# Patient Record
Sex: Female | Born: 1970
Health system: Southern US, Community
[De-identification: ages and names within clinical notes are randomized; demographics above are authoritative.]

## PROBLEM LIST (undated history)

## (undated) DIAGNOSIS — D219 Benign neoplasm of connective and other soft tissue, unspecified: Secondary | ICD-10-CM

## (undated) HISTORY — PX: KNEE ARTHROPLASTY: SHX992

---

## 2015-09-13 ENCOUNTER — Other Ambulatory Visit: Payer: Self-pay

## 2015-09-13 DIAGNOSIS — Z1231 Encounter for screening mammogram for malignant neoplasm of breast: Secondary | ICD-10-CM

## 2015-10-04 ENCOUNTER — Ambulatory Visit: Payer: Self-pay

## 2015-10-20 ENCOUNTER — Ambulatory Visit
Admission: RE | Admit: 2015-10-20 | Discharge: 2015-10-20 | Disposition: A | Discharge status: Home / Self Care | Payer: 59 | Source: Ambulatory Visit

## 2015-10-20 DIAGNOSIS — Z1231 Encounter for screening mammogram for malignant neoplasm of breast: Secondary | ICD-10-CM

## 2016-07-16 DIAGNOSIS — H18469 Peripheral corneal degeneration, unspecified eye: Secondary | ICD-10-CM | POA: Diagnosis not present

## 2016-07-16 DIAGNOSIS — H524 Presbyopia: Secondary | ICD-10-CM | POA: Diagnosis not present

## 2016-07-16 MED FILL — RESTASIS MULTIDOSE 0.05% EY: 0.05 | 90 days supply | Qty: 17 | Fill #0

## 2016-09-21 DIAGNOSIS — N951 Menopausal and female climacteric states: Secondary | ICD-10-CM | POA: Diagnosis not present

## 2016-09-21 DIAGNOSIS — N920 Excessive and frequent menstruation with regular cycle: Secondary | ICD-10-CM | POA: Diagnosis not present

## 2016-09-21 DIAGNOSIS — Z01419 Encounter for gynecological examination (general) (routine) without abnormal findings: Secondary | ICD-10-CM | POA: Diagnosis not present

## 2016-11-27 ENCOUNTER — Other Ambulatory Visit: Payer: Self-pay | Admitting: Obstetrics and Gynecology

## 2016-11-27 DIAGNOSIS — Z1239 Encounter for other screening for malignant neoplasm of breast: Secondary | ICD-10-CM

## 2016-11-28 ENCOUNTER — Other Ambulatory Visit: Payer: Self-pay | Admitting: Obstetrics and Gynecology

## 2016-11-30 ENCOUNTER — Ambulatory Visit
Admission: RE | Admit: 2016-11-30 | Discharge: 2016-11-30 | Disposition: A | Discharge status: Home / Self Care | Payer: 59 | Source: Ambulatory Visit | Attending: Obstetrics and Gynecology | Admitting: Obstetrics and Gynecology

## 2016-11-30 DIAGNOSIS — Z1231 Encounter for screening mammogram for malignant neoplasm of breast: Secondary | ICD-10-CM | POA: Diagnosis not present

## 2016-11-30 DIAGNOSIS — Z1239 Encounter for other screening for malignant neoplasm of breast: Secondary | ICD-10-CM

## 2017-03-04 DIAGNOSIS — H18469 Peripheral corneal degeneration, unspecified eye: Secondary | ICD-10-CM | POA: Diagnosis not present

## 2017-03-04 DIAGNOSIS — H524 Presbyopia: Secondary | ICD-10-CM | POA: Diagnosis not present

## 2018-03-05 ENCOUNTER — Other Ambulatory Visit: Payer: Self-pay | Admitting: Obstetrics and Gynecology

## 2018-03-05 DIAGNOSIS — Z1231 Encounter for screening mammogram for malignant neoplasm of breast: Secondary | ICD-10-CM

## 2018-03-31 ENCOUNTER — Ambulatory Visit
Admission: RE | Admit: 2018-03-31 | Discharge: 2018-03-31 | Disposition: A | Discharge status: Home / Self Care | Payer: 59 | Source: Ambulatory Visit | Attending: Obstetrics and Gynecology | Admitting: Obstetrics and Gynecology

## 2018-03-31 ENCOUNTER — Encounter: Payer: Self-pay | Admitting: Radiology

## 2018-03-31 DIAGNOSIS — Z1231 Encounter for screening mammogram for malignant neoplasm of breast: Secondary | ICD-10-CM

## 2018-04-29 DIAGNOSIS — N920 Excessive and frequent menstruation with regular cycle: Secondary | ICD-10-CM | POA: Diagnosis not present

## 2018-04-29 DIAGNOSIS — Z124 Encounter for screening for malignant neoplasm of cervix: Secondary | ICD-10-CM | POA: Diagnosis not present

## 2018-04-29 DIAGNOSIS — Z1151 Encounter for screening for human papillomavirus (HPV): Secondary | ICD-10-CM | POA: Diagnosis not present

## 2018-04-29 DIAGNOSIS — Z01419 Encounter for gynecological examination (general) (routine) without abnormal findings: Secondary | ICD-10-CM | POA: Diagnosis not present

## 2018-08-18 DIAGNOSIS — H1045 Other chronic allergic conjunctivitis: Secondary | ICD-10-CM | POA: Diagnosis not present

## 2018-12-25 DIAGNOSIS — N951 Menopausal and female climacteric states: Secondary | ICD-10-CM | POA: Diagnosis not present

## 2018-12-25 DIAGNOSIS — N920 Excessive and frequent menstruation with regular cycle: Secondary | ICD-10-CM | POA: Diagnosis not present

## 2018-12-25 DIAGNOSIS — D251 Intramural leiomyoma of uterus: Secondary | ICD-10-CM | POA: Diagnosis not present

## 2018-12-25 DIAGNOSIS — Z30011 Encounter for initial prescription of contraceptive pills: Secondary | ICD-10-CM | POA: Diagnosis not present

## 2018-12-25 DIAGNOSIS — R1032 Left lower quadrant pain: Secondary | ICD-10-CM | POA: Diagnosis not present

## 2018-12-25 MED FILL — LARIN 21 1-20 TABLET: 1-20 | 84 days supply | Qty: 84 | Fill #0

## 2019-01-01 ENCOUNTER — Other Ambulatory Visit: Payer: Self-pay

## 2019-01-01 ENCOUNTER — Emergency Department (HOSPITAL_COMMUNITY): Payer: 59

## 2019-01-01 ENCOUNTER — Encounter (HOSPITAL_COMMUNITY): Payer: Self-pay | Admitting: *Deleted

## 2019-01-01 ENCOUNTER — Encounter (HOSPITAL_BASED_OUTPATIENT_CLINIC_OR_DEPARTMENT_OTHER): Payer: Self-pay | Admitting: *Deleted

## 2019-01-01 ENCOUNTER — Emergency Department (HOSPITAL_COMMUNITY)
Admission: EM | Admit: 2019-01-01 | Discharge: 2019-01-01 | Disposition: A | Discharge status: Home / Self Care | Payer: 59 | Attending: Emergency Medicine | Admitting: Emergency Medicine

## 2019-01-01 DIAGNOSIS — S8992XA Unspecified injury of left lower leg, initial encounter: Secondary | ICD-10-CM | POA: Diagnosis present

## 2019-01-01 DIAGNOSIS — Y9302 Activity, running: Secondary | ICD-10-CM | POA: Diagnosis not present

## 2019-01-01 DIAGNOSIS — X501XXA Overexertion from prolonged static or awkward postures, initial encounter: Secondary | ICD-10-CM | POA: Insufficient documentation

## 2019-01-01 DIAGNOSIS — S83242A Other tear of medial meniscus, current injury, left knee, initial encounter: Secondary | ICD-10-CM | POA: Diagnosis not present

## 2019-01-01 DIAGNOSIS — Y999 Unspecified external cause status: Secondary | ICD-10-CM | POA: Insufficient documentation

## 2019-01-01 DIAGNOSIS — M25462 Effusion, left knee: Secondary | ICD-10-CM | POA: Diagnosis not present

## 2019-01-01 DIAGNOSIS — Y929 Unspecified place or not applicable: Secondary | ICD-10-CM | POA: Diagnosis not present

## 2019-01-01 DIAGNOSIS — M25562 Pain in left knee: Secondary | ICD-10-CM | POA: Diagnosis not present

## 2019-01-01 MED ORDER — OXYCODONE-ACETAMINOPHEN 5-325 MG PO TABS: 1.0000 | ORAL_TABLET | Freq: Four times a day (QID) | ORAL | 0 refills | Status: DC | PRN

## 2019-01-01 MED ORDER — ACETAMINOPHEN 325 MG PO TABS
650.0000 mg | ORAL_TABLET | Freq: Once | ORAL | Status: AC
  Administered 2019-01-01: 650 mg via ORAL
  Filled 2019-01-01: qty 2

## 2019-01-01 MED FILL — OXYCODONE-ACETAMINOPHEN 5-3: 5-325 | 4 days supply | Qty: 10 | Fill #0

## 2019-01-01 NOTE — ED Notes (Signed)
Pt back from MRI 

## 2019-01-01 NOTE — Discharge Instructions (Addendum)
Please read attached information. If you experience any new or worsening signs or symptoms please return to the emergency room for evaluation. Please follow-up with your primary care provider or specialist as discussed. Please use medication prescribed only as directed and discontinue taking if you have any concerning signs or symptoms.   °

## 2019-01-01 NOTE — ED Provider Notes (Signed)
[facility] EMERGENCY DEPARTMENT Provider Note   CSN: [id number] Arrival date & time: 01/01/19  6195   History   Chief Complaint Chief Complaint  Patient presents with  . Knee Pain    HPI Heather Ray is a 48 y.o. female.  HPI   48 year old female presents today with complaints of left knee pain.  She notes that she is an active runner.  She notes over the weekend she was running on the beach she noted some left lateral knee pain.  She notes that symptoms often go away with rest.  She reports that this morning she was giving a tour walking upstairs when she felt a pop in her left knee.  She notes she was unable to bear weight thereafter.  She notes the pain is located in the posterior knee but is not reproduced with palpation.  She notes pain with flexion.  She does not have an orthopedic surgeon.  No medications prior to arrival.  History reviewed. No pertinent past medical history.  There are no active problems to display for this patient.   History reviewed. No pertinent surgical history.   OB History   No obstetric history on file.      Home Medications    Prior to Admission medications   Medication Sig Start Date End Date Taking? Authorizing Provider  oxyCODONE-acetaminophen (PERCOCET/ROXICET) 5-325 MG tablet Take 1 tablet by mouth every 6 (six) hours as needed for severe pain. 01/01/19   Okey Regal, PA-C    Family History Family History  Problem Relation Age of Onset  . Breast cancer Mother 84    Social History Social History   Tobacco Use  . Smoking status: Not on file  Substance Use Topics  . Alcohol use: Not on file  . Drug use: Not on file     Allergies   Patient has no known allergies.   Review of Systems Review of Systems  All other systems reviewed and are negative.    Physical Exam Updated Vital Signs BP (!) 150/95   Pulse 86   Temp 98.8 F (37.1 C)   Resp 16   SpO2 100%   Physical Exam Vitals signs  and nursing note reviewed.  Constitutional:      Appearance: She is well-developed.  HENT:     Head: Normocephalic and atraumatic.  Eyes:     General: No scleral icterus.       Right eye: No discharge.        Left eye: No discharge.     Conjunctiva/sclera: Conjunctivae normal.     Pupils: Pupils are equal, round, and reactive to light.  Neck:     Musculoskeletal: Normal range of motion.     Vascular: No JVD.     Trachea: No tracheal deviation.  Pulmonary:     Effort: Pulmonary effort is normal.     Breath sounds: No stridor.  Musculoskeletal:     Comments: Left knee atraumatic symmetrical bilateral, no anterior tenderness, no significant posterior or anterior tibial translation, no significant tenderness to palpation of the popliteal fossa or hamstrings-pain with approximately 100 degrees of flexion along the posterior aspect of the knee-no valgus or varus laxity  Neurological:     Mental Status: She is alert and oriented to person, place, and time.     Coordination: Coordination normal.  Psychiatric:        Behavior: Behavior normal.        Thought Content: Thought content normal.  Judgment: Judgment normal.      ED Treatments / Results  Labs (all labs ordered are listed, but only abnormal results are displayed) Labs Reviewed - No data to display  EKG None  Radiology Mr Knee Left Wo Contrast  Result Date: 01/01/2019 CLINICAL DATA:  Left knee pain and difficulty bearing weight since running on a beach 5-6 days ago. Initial encounter. EXAM: MRI OF THE LEFT KNEE WITHOUT CONTRAST TECHNIQUE: Multiplanar, multisequence MR imaging of the knee was performed. No intravenous contrast was administered. COMPARISON:  Plain films left knee this same day. FINDINGS: MENISCI Medial meniscus: There is a near complete radial tear through the root of the posterior horn. Mild intrasubstance degenerative signal is seen in the remainder of the posterior horn. Lateral meniscus:  Intact.  LIGAMENTS Cruciates:  Intact. Collaterals:  Intact. CARTILAGE Patellofemoral: Cartilage thinning is most notable at the patellar apex in the superior pole and along the medial facet. Medial:  Preserved. Lateral:  Preserved. Joint:  Small effusion. Popliteal Fossa:  Tiny Baker's cyst. Extensor Mechanism:  Intact. Bones: No fracture or worrisome lesion. Small subchondral cysts are seen at the patellar apex in the superior pole. Other: None. IMPRESSION: Near complete radial tear through the root of the posterior horn of the medial meniscus. Negative for ligament tear. Moderate patellofemoral osteoarthritis. Electronically Signed   By: Inge Rise M.D.   On: 01/01/2019 13:17   Dg Knee Complete 4 Views Left  Result Date: 01/01/2019 CLINICAL DATA:  Left lateral knee pain, initial encounter EXAM: LEFT KNEE - COMPLETE 4+ VIEW COMPARISON:  None. FINDINGS: Mild patellofemoral degenerative changes are noted. No acute fracture or dislocation is seen. No joint effusion is noted. IMPRESSION: Mild patellofemoral changes.  No acute abnormality noted. Electronically Signed   By: Inez Catalina M.D.   On: 01/01/2019 10:27    Procedures Procedures (including critical care time)  Medications Ordered in ED Medications  acetaminophen (TYLENOL) tablet 650 mg (650 mg Oral Given 01/01/19 1032)    Initial Impression / Assessment and Plan / ED Course  I have reviewed the triage vital signs and the nursing notes.  Pertinent labs & imaging results that were available during my care of the patient were reviewed by me and considered in my medical decision making (see chart for details).       Assessment/Plan: 48 year old female presents today with meniscal tear.  Patient has close follow-up arranged with orthopedic specialist tomorrow morning.  She was given pain medicine, knee immobilizer and crutches.  She is given strict return precautions.  She verbalized understanding and agreement to today's plan.   Final  Clinical Impressions(s) / ED Diagnoses   Final diagnoses:  Acute medial meniscus tear of left knee, initial encounter    ED Discharge Orders         Ordered    oxyCODONE-acetaminophen (PERCOCET/ROXICET) 5-325 MG tablet  Every 6 hours PRN     01/01/19 1332           Okey Regal, PA-C 01/01/19 1335    Tegeler, Gwenyth Allegra, MD 01/01/19 1606

## 2019-01-01 NOTE — ED Triage Notes (Signed)
Pt was walking and felt a pop in left knee. Unable bare weight on left leg.

## 2019-01-01 NOTE — ED Notes (Signed)
Pt up to void , MRI called on their way

## 2019-01-01 NOTE — ED Notes (Signed)
Patient transported to X-ray 

## 2019-01-02 ENCOUNTER — Encounter (HOSPITAL_BASED_OUTPATIENT_CLINIC_OR_DEPARTMENT_OTHER): Payer: Self-pay

## 2019-01-02 ENCOUNTER — Other Ambulatory Visit: Payer: Self-pay

## 2019-01-02 ENCOUNTER — Ambulatory Visit (HOSPITAL_BASED_OUTPATIENT_CLINIC_OR_DEPARTMENT_OTHER): Payer: 59 | Admitting: Anesthesiology

## 2019-01-02 ENCOUNTER — Ambulatory Visit (HOSPITAL_BASED_OUTPATIENT_CLINIC_OR_DEPARTMENT_OTHER)
Admission: RE | Admit: 2019-01-02 | Discharge: 2019-01-02 | Disposition: A | Discharge status: Home / Self Care | Payer: 59 | Attending: Orthopaedic Surgery | Admitting: Orthopaedic Surgery

## 2019-01-02 ENCOUNTER — Encounter (HOSPITAL_BASED_OUTPATIENT_CLINIC_OR_DEPARTMENT_OTHER)
Admission: RE | Disposition: A | Discharge status: Home / Self Care | Payer: Self-pay | Source: Home / Self Care | Attending: Orthopaedic Surgery

## 2019-01-02 DIAGNOSIS — S83242A Other tear of medial meniscus, current injury, left knee, initial encounter: Secondary | ICD-10-CM | POA: Diagnosis not present

## 2019-01-02 DIAGNOSIS — Z803 Family history of malignant neoplasm of breast: Secondary | ICD-10-CM | POA: Insufficient documentation

## 2019-01-02 DIAGNOSIS — Z79899 Other long term (current) drug therapy: Secondary | ICD-10-CM | POA: Insufficient documentation

## 2019-01-02 DIAGNOSIS — Y9302 Activity, running: Secondary | ICD-10-CM | POA: Diagnosis not present

## 2019-01-02 DIAGNOSIS — M1712 Unilateral primary osteoarthritis, left knee: Secondary | ICD-10-CM | POA: Insufficient documentation

## 2019-01-02 HISTORY — PX: KNEE ARTHROSCOPY WITH MEDIAL MENISECTOMY: SHX5651

## 2019-01-02 HISTORY — DX: Benign neoplasm of connective and other soft tissue, unspecified: D21.9

## 2019-01-02 LAB — POCT PREGNANCY, URINE: Preg Test, Ur: NEGATIVE

## 2019-01-02 SURGERY — ARTHROSCOPY, KNEE, WITH MEDIAL MENISCECTOMY
Anesthesia: General | Site: Knee | Laterality: Left

## 2019-01-02 MED ORDER — DEXAMETHASONE SODIUM PHOSPHATE 10 MG/ML IJ SOLN
INTRAMUSCULAR | Status: AC
  Filled 2019-01-02: qty 1

## 2019-01-02 MED ORDER — GABAPENTIN 300 MG PO CAPS
300.0000 mg | ORAL_CAPSULE | Freq: Once | ORAL | Status: AC
  Administered 2019-01-02: 300 mg via ORAL

## 2019-01-02 MED ORDER — CHLORHEXIDINE GLUCONATE 4 % EX LIQD: 60.0000 mL | Freq: Once | CUTANEOUS | Status: DC

## 2019-01-02 MED ORDER — PROPOFOL 10 MG/ML IV BOLUS
INTRAVENOUS | Status: DC | PRN
  Administered 2019-01-02: 150 mg via INTRAVENOUS

## 2019-01-02 MED ORDER — FENTANYL CITRATE (PF) 100 MCG/2ML IJ SOLN
25.0000 ug | INTRAMUSCULAR | Status: DC | PRN
  Administered 2019-01-02 (×2): 50 ug via INTRAVENOUS

## 2019-01-02 MED ORDER — MIDAZOLAM HCL 2 MG/2ML IJ SOLN
INTRAMUSCULAR | Status: AC
  Filled 2019-01-02: qty 2

## 2019-01-02 MED ORDER — CEFAZOLIN SODIUM-DEXTROSE 2-4 GM/100ML-% IV SOLN
INTRAVENOUS | Status: AC
  Filled 2019-01-02: qty 100

## 2019-01-02 MED ORDER — FENTANYL CITRATE (PF) 100 MCG/2ML IJ SOLN
INTRAMUSCULAR | Status: DC | PRN
  Administered 2019-01-02: 100 ug via INTRAVENOUS

## 2019-01-02 MED ORDER — ONDANSETRON HCL 4 MG/2ML IJ SOLN
INTRAMUSCULAR | Status: AC
  Filled 2019-01-02: qty 2

## 2019-01-02 MED ORDER — FENTANYL CITRATE (PF) 100 MCG/2ML IJ SOLN
INTRAMUSCULAR | Status: AC
  Filled 2019-01-02: qty 2

## 2019-01-02 MED ORDER — FENTANYL CITRATE (PF) 100 MCG/2ML IJ SOLN: 50.0000 ug | INTRAMUSCULAR | Status: DC | PRN

## 2019-01-02 MED ORDER — ASPIRIN 81 MG PO TABS: 81.0000 mg | ORAL_TABLET | Freq: Every day | ORAL | 0 refills | Status: AC

## 2019-01-02 MED ORDER — ACETAMINOPHEN 500 MG PO TABS
1000.0000 mg | ORAL_TABLET | Freq: Once | ORAL | Status: AC
  Administered 2019-01-02: 1000 mg via ORAL

## 2019-01-02 MED ORDER — PROMETHAZINE HCL 25 MG/ML IJ SOLN: 6.2500 mg | INTRAMUSCULAR | Status: DC | PRN

## 2019-01-02 MED ORDER — LACTATED RINGERS IV SOLN
INTRAVENOUS | Status: DC | PRN
  Administered 2019-01-02: 13:00:00 via INTRAVENOUS

## 2019-01-02 MED ORDER — SCOPOLAMINE 1 MG/3DAYS TD PT72: 1.0000 | MEDICATED_PATCH | Freq: Once | TRANSDERMAL | Status: DC | PRN

## 2019-01-02 MED ORDER — MIDAZOLAM HCL 5 MG/5ML IJ SOLN
INTRAMUSCULAR | Status: DC | PRN
  Administered 2019-01-02: 2 mg via INTRAVENOUS

## 2019-01-02 MED ORDER — ONDANSETRON HCL 4 MG/2ML IJ SOLN
INTRAMUSCULAR | Status: DC | PRN
  Administered 2019-01-02: 4 mg via INTRAVENOUS

## 2019-01-02 MED ORDER — DEXAMETHASONE SODIUM PHOSPHATE 4 MG/ML IJ SOLN
INTRAMUSCULAR | Status: DC | PRN
  Administered 2019-01-02: 10 mg via INTRAVENOUS

## 2019-01-02 MED ORDER — BUPIVACAINE HCL (PF) 0.25 % IJ SOLN
INTRAMUSCULAR | Status: AC
  Filled 2019-01-02: qty 30

## 2019-01-02 MED ORDER — LIDOCAINE 2% (20 MG/ML) 5 ML SYRINGE
INTRAMUSCULAR | Status: DC | PRN
  Administered 2019-01-02: 50 mg via INTRAVENOUS

## 2019-01-02 MED ORDER — SCOPOLAMINE 1 MG/3DAYS TD PT72
MEDICATED_PATCH | TRANSDERMAL | Status: AC
  Filled 2019-01-02: qty 1

## 2019-01-02 MED ORDER — GABAPENTIN 300 MG PO CAPS
ORAL_CAPSULE | ORAL | Status: AC
  Filled 2019-01-02: qty 1

## 2019-01-02 MED ORDER — SODIUM CHLORIDE 0.9 % IR SOLN
Status: DC | PRN
  Administered 2019-01-02: 800 mL

## 2019-01-02 MED ORDER — OXYCODONE HCL 5 MG PO TABS: ORAL_TABLET | ORAL | 0 refills | Status: AC

## 2019-01-02 MED ORDER — MIDAZOLAM HCL 2 MG/2ML IJ SOLN: 1.0000 mg | INTRAMUSCULAR | Status: DC | PRN

## 2019-01-02 MED ORDER — ONDANSETRON HCL 4 MG PO TABS: 4.0000 mg | ORAL_TABLET | Freq: Three times a day (TID) | ORAL | 1 refills | Status: AC | PRN

## 2019-01-02 MED ORDER — KETOROLAC TROMETHAMINE 30 MG/ML IJ SOLN
INTRAMUSCULAR | Status: AC
  Filled 2019-01-02: qty 1

## 2019-01-02 MED ORDER — LACTATED RINGERS IV SOLN: INTRAVENOUS | Status: DC

## 2019-01-02 MED ORDER — MELOXICAM 7.5 MG PO TABS: 7.5000 mg | ORAL_TABLET | Freq: Every day | ORAL | 2 refills | Status: DC

## 2019-01-02 MED ORDER — SCOPOLAMINE 1 MG/3DAYS TD PT72
1.0000 | MEDICATED_PATCH | Freq: Once | TRANSDERMAL | Status: DC
  Administered 2019-01-02: 1.5 mg via TRANSDERMAL

## 2019-01-02 MED ORDER — LIDOCAINE 2% (20 MG/ML) 5 ML SYRINGE
INTRAMUSCULAR | Status: AC
  Filled 2019-01-02: qty 5

## 2019-01-02 MED ORDER — CEFAZOLIN SODIUM-DEXTROSE 2-4 GM/100ML-% IV SOLN
2.0000 g | INTRAVENOUS | Status: AC
  Administered 2019-01-02: 2 g via INTRAVENOUS

## 2019-01-02 MED ORDER — SODIUM CHLORIDE 0.9 % IR SOLN
Status: DC | PRN
  Administered 2019-01-02: 2 mL

## 2019-01-02 MED ORDER — ACETAMINOPHEN 500 MG PO TABS
ORAL_TABLET | ORAL | Status: AC
  Filled 2019-01-02: qty 2

## 2019-01-02 MED ORDER — PROPOFOL 10 MG/ML IV BOLUS
INTRAVENOUS | Status: AC
  Filled 2019-01-02: qty 20

## 2019-01-02 MED ORDER — BUPIVACAINE HCL (PF) 0.25 % IJ SOLN
INTRAMUSCULAR | Status: DC | PRN
  Administered 2019-01-02: 20 mL

## 2019-01-02 MED ORDER — EPINEPHRINE 30 MG/30ML IJ SOLN
INTRAMUSCULAR | Status: AC
  Filled 2019-01-02: qty 1

## 2019-01-02 MED ORDER — KETOROLAC TROMETHAMINE 30 MG/ML IJ SOLN
INTRAMUSCULAR | Status: DC | PRN
  Administered 2019-01-02: 30 mg via INTRAVENOUS

## 2019-01-02 MED ORDER — ACETAMINOPHEN 500 MG PO TABS: 1000.0000 mg | ORAL_TABLET | Freq: Three times a day (TID) | ORAL | 0 refills | Status: AC

## 2019-01-02 MED FILL — ONDANSETRON HCL 4 MG TABLET: 4 | 4 days supply | Qty: 10 | Fill #0

## 2019-01-02 MED FILL — MELOXICAM 7.5 MG TABLET: 7.5 | 30 days supply | Qty: 30 | Fill #0

## 2019-01-02 MED FILL — oxyCODONE HCL 5 MG TABS: 5 | 3 days supply | Qty: 15 | Fill #0

## 2019-01-02 MED FILL — ASPIRIN ADULT LOW STRENGTH: 81 | 45 days supply | Qty: 45 | Fill #0

## 2019-01-02 SURGICAL SUPPLY — 42 items
BANDAGE ACE 6X5 VEL STRL LF (GAUZE/BANDAGES/DRESSINGS) ×3 IMPLANT
BANDAGE ESMARK 6X9 LF (GAUZE/BANDAGES/DRESSINGS) IMPLANT
BENZOIN TINCTURE PRP APPL 2/3 (GAUZE/BANDAGES/DRESSINGS) IMPLANT
BLADE CLIPPER SURG (BLADE) IMPLANT
BNDG ESMARK 6X9 LF (GAUZE/BANDAGES/DRESSINGS)
CHLORAPREP W/TINT 26ML (MISCELLANEOUS) ×3 IMPLANT
CLOSURE STERI-STRIP 1/2X4 (GAUZE/BANDAGES/DRESSINGS) ×1
CLSR STERI-STRIP ANTIMIC 1/2X4 (GAUZE/BANDAGES/DRESSINGS) ×2 IMPLANT
CUFF TOURNIQUET SINGLE 34IN LL (TOURNIQUET CUFF) ×3 IMPLANT
DISSECTOR 3.5MM X 13CM CVD (MISCELLANEOUS) ×3 IMPLANT
DISSECTOR 4.0MMX13CM CVD (MISCELLANEOUS) IMPLANT
DRAPE ARTHROSCOPY W/POUCH 90 (DRAPES) ×3 IMPLANT
DRAPE IMP U-DRAPE 54X76 (DRAPES) ×3 IMPLANT
DRAPE INCISE IOBAN 66X45 STRL (DRAPES) IMPLANT
DRAPE U-SHAPE 47X51 STRL (DRAPES) ×3 IMPLANT
GAUZE SPONGE 4X4 12PLY STRL (GAUZE/BANDAGES/DRESSINGS) IMPLANT
GLOVE BIO SURGEON STRL SZ 6.5 (GLOVE) ×2 IMPLANT
GLOVE BIO SURGEONS STRL SZ 6.5 (GLOVE) ×1
GLOVE BIOGEL PI IND STRL 7.0 (GLOVE) ×1 IMPLANT
GLOVE BIOGEL PI IND STRL 8 (GLOVE) ×1 IMPLANT
GLOVE BIOGEL PI INDICATOR 7.0 (GLOVE) ×2
GLOVE BIOGEL PI INDICATOR 8 (GLOVE) ×2
GLOVE ECLIPSE 8.0 STRL XLNG CF (GLOVE) ×6 IMPLANT
GLOVE EXAM NITRILE MD LF STRL (GLOVE) ×3 IMPLANT
GOWN STRL REUS W/ TWL LRG LVL3 (GOWN DISPOSABLE) ×1 IMPLANT
GOWN STRL REUS W/TWL LRG LVL3 (GOWN DISPOSABLE) ×2
GOWN STRL REUS W/TWL XL LVL3 (GOWN DISPOSABLE) ×6 IMPLANT
IV NS IRRIG 3000ML ARTHROMATIC (IV SOLUTION) ×6 IMPLANT
KIT TURNOVER KIT B (KITS) ×3 IMPLANT
MANIFOLD NEPTUNE II (INSTRUMENTS) ×3 IMPLANT
NDL SAFETY ECLIPSE 18X1.5 (NEEDLE) ×1 IMPLANT
NEEDLE HYPO 18GX1.5 SHARP (NEEDLE) ×2
NS IRRIG 1000ML POUR BTL (IV SOLUTION) IMPLANT
PACK ARTHROSCOPY DSU (CUSTOM PROCEDURE TRAY) ×3 IMPLANT
SLEEVE SCD COMPRESS KNEE MED (MISCELLANEOUS) IMPLANT
SUT MNCRL AB 4-0 PS2 18 (SUTURE) ×3 IMPLANT
SYR 5ML LUER SLIP (SYRINGE) ×3 IMPLANT
TOWEL GREEN STERILE FF (TOWEL DISPOSABLE) ×3 IMPLANT
TUBE CONNECTING 20'X1/4 (TUBING) ×1
TUBE CONNECTING 20X1/4 (TUBING) ×2 IMPLANT
TUBING ARTHROSCOPY IRRIG 16FT (MISCELLANEOUS) ×3 IMPLANT
WATER STERILE IRR 1000ML POUR (IV SOLUTION) IMPLANT

## 2019-01-02 NOTE — Op Note (Signed)
Orthopaedic Surgery Operative Note (CSN: [id number])  Heather Ray  03/09/1971 Date of Surgery: 01/02/2019   Diagnoses:  LEFT MEDIAL MENISCUS TEAR  Procedure: Left knee partial medial meniscectomy 29881 Left knee patellar chondroplasty 54656  Operative Finding Successful completion of planned procedure.  Patient's meniscal root tear was not completely torn but was clearly unstable and causing mechanical type symptoms.  We felt based on her patellofemoral arthritis as well as early changes in her medial compartment was better suited to meniscectomy versus the long recovery and the possible stiffness and potential failure of a meniscal root repair.  Exam under anesthesia: Range of motion full and symmetric to opposite knee, ligamentously stable exam with normal lachman  Suprapatellar pouch: Patellar pouch was normal but there was grade 4 chondral loss in the medial facet of the patella.  This was debrided back to a stable base, there is grade 1 changes in the trochlea.  Medial compartment: Grade 2 changes on the femoral condyle and grade 1 changes in the tibia.  We felt the patient be better served with a partial meniscectomy so we performed a 30% total meniscal volume reduction of the posterior medial meniscus.  The root was not completely torn however it was clearly unstable in regards to the torn portion.  Lateral Compartment: Preserved  Intercondylar Notch: Normal  Post-operative plan: The patient will be discharged home weightbearing as tolerated.  The patient will be not indicated for therapy and the stiffness is present.  DVT prophylaxis aspirin 81 mg a day.  Pain control with PRN pain medication preferring oral medicines.  Follow up plan will be scheduled in approximately 7 days.  Post-Op Diagnosis: Same Surgeons:Primary: Hiram Gash, MD Assistants: Joya Gaskins, OPAC Location: [facility] OR ROOM 3 Anesthesia: General Antibiotics: Ancef 2g preop Tourniquet time:  Total Tourniquet  Time Documented: Thigh (Left) - 25 minutes Total: Thigh (Left) - 25 minutes  Estimated Blood Loss: Minimal Complications: None Specimens: None Implants: * No implants in log *  Indications for Surgery:   Heather Ray is a 48 y.o. female with acute injury mechanical symptoms limiting weightbearing and active healthy person.  She was significantly debilitated by her symptoms even after bracing and reduced weightbearing.  Benefits and risks of operative and nonoperative management were discussed prior to surgery with patient/guardian(s) and informed consent form was completed.  Specific risks including infection, need for additional surgery, continued symptoms, postoperative arthrosis, stiffness, need for therapy.   Procedure:   The patient was identified properly. Informed consent was obtained and the surgical site was marked. The patient was taken up to suite where general anesthesia was induced. The patient was placed in the supine position with a post against the surgical leg and a nonsterile tourniquet applied. The surgical leg was then prepped and draped usual sterile fashion.  A standard surgical timeout was performed.  2 standard anterior portals were made and diagnostic arthroscopy performed. Please note the findings as noted above.  Perform diagnostic arthroscopy and findings are above.  We felt the meniscus was not amenable to root repair and performed a debridement using arthroscopic baskets as well as a shaving device.  This was taken back to a stable base.  20% total meniscal volume resected and no additional unstable remnant was noted.  Gentle chondroplasty was performed with a shaver of the medial facet of the patella.   Incisions closed with absorbable suture. The patient was awoken from general anesthesia and taken to the PACU in stable condition without complication.  Joya Gaskins, OPA-C, present and scrubbed throughout the case, critical for completion in a timely  fashion, and for retraction, instrumentation, closure.

## 2019-01-02 NOTE — Anesthesia Procedure Notes (Signed)
Procedure Name: LMA Insertion Performed by: Emilliano Dilworth W, CRNA Pre-anesthesia Checklist: Patient identified, Emergency Drugs available, Suction available and Patient being monitored Patient Re-evaluated:Patient Re-evaluated prior to induction Oxygen Delivery Method: Circle system utilized Preoxygenation: Pre-oxygenation with 100% oxygen Induction Type: IV induction Ventilation: Mask ventilation without difficulty LMA: LMA inserted LMA Size: 4.0 Number of attempts: 1 Placement Confirmation: positive ETCO2 Tube secured with: Tape Dental Injury: Teeth and Oropharynx as per pre-operative assessment        

## 2019-01-02 NOTE — Transfer of Care (Signed)
Immediate Anesthesia Transfer of Care Note  Patient: Heather Ray  Procedure(s) Performed: LEFT KNEE ARTHROSCOPY WITH PARTIAL MEDIAL MENISECTOMY (Left Knee)  Patient Location: PACU  Anesthesia Type:General  Level of Consciousness: awake and sedated  Airway & Oxygen Therapy: Patient Spontanous Breathing and Patient connected to face mask oxygen  Post-op Assessment: Report given to RN and Post -op Vital signs reviewed and stable  Post vital signs: Reviewed and stable  Last Vitals:  Vitals Value Taken Time  BP 136/82 01/02/2019  2:02 PM  Temp    Pulse 87 01/02/2019  2:03 PM  Resp 13 01/02/2019  2:03 PM  SpO2 97 % 01/02/2019  2:03 PM  Vitals shown include unvalidated device data.  Last Pain:  Vitals:   01/02/19 1021  TempSrc: Oral  PainSc: 4       Patients Stated Pain Goal: 4 (46/50/35 4656)  Complications: No apparent anesthesia complications

## 2019-01-02 NOTE — H&P (Signed)
PREOPERATIVE H&P  Chief Complaint: LEFT MEDIAL MENISCUS TEAR  HPI: Heather Ray is a 48 y.o. female who presents for preoperative history and physical with a diagnosis of LEFT MEDIAL MENISCUS TEAR. Symptoms are rated as moderate to severe, and have been worsening.  This is significantly impairing activities of daily living.  Please see my clinic note for full details on this patient's care.  She has elected for surgical management.   Past Medical History:  Diagnosis Date  . Fibroids    Past Surgical History:  Procedure Laterality Date  . CESAREAN SECTION    . KNEE ARTHROPLASTY     Social History   Socioeconomic History  . Marital status: Married    Spouse name: Not on file  . Number of children: Not on file  . Years of education: Not on file  . Highest education level: Not on file  Occupational History  . Not on file  Social Needs  . Financial resource strain: Not on file  . Food insecurity:    Worry: Not on file    Inability: Not on file  . Transportation needs:    Medical: Not on file    Non-medical: Not on file  Tobacco Use  . Smoking status: Never Smoker  . Smokeless tobacco: Never Used  Substance and Sexual Activity  . Alcohol use: Never    Frequency: Never  . Drug use: Never  . Sexual activity: Not on file  Lifestyle  . Physical activity:    Days per week: Not on file    Minutes per session: Not on file  . Stress: Not on file  Relationships  . Social connections:    Talks on phone: Not on file    Gets together: Not on file    Attends religious service: Not on file    Active member of club or organization: Not on file    Attends meetings of clubs or organizations: Not on file    Relationship status: Not on file  Other Topics Concern  . Not on file  Social History Narrative  . Not on file   Family History  Problem Relation Age of Onset  . Breast cancer Mother 12   No Known Allergies Prior to Admission medications   Medication Sig Start Date End  Date Taking? Authorizing Provider  norethindrone-ethinyl estradiol Ronnette Juniper 1/20) 1-20 MG-MCG tablet Take 1 tablet by mouth daily.   Yes [provider]  oxyCODONE-acetaminophen (PERCOCET/ROXICET) 5-325 MG tablet Take 1 tablet by mouth every 6 (six) hours as needed for severe pain. 01/01/19  Yes Hedges, Dellis Filbert, PA-C     Positive ROS: All other systems have been reviewed and were otherwise negative with the exception of those mentioned in the HPI and as above.  Physical Exam: General: Alert, no acute distress Cardiovascular: No pedal edema Respiratory: No cyanosis, no use of accessory musculature GI: No organomegaly, abdomen is soft and non-tender Skin: No lesions in the area of chief complaint Neurologic: Sensation intact distally Psychiatric: Patient is competent for consent with normal mood and affect Lymphatic: No axillary or cervical lymphadenopathy  MUSCULOSKELETAL: LLE: limited ROM, pain medially and laterally.  WWP extremity.  Assessment: LEFT MEDIAL MENISCUS TEAR  Plan: Plan for Procedure(s): KNEE ARTHROSCOPY WITH MEDIAL MENISECTOMY VERSES MEDIAL MENISCUS REPAIR  The risks benefits and alternatives were discussed with the patient including but not limited to the risks of nonoperative treatment, versus surgical intervention including infection, bleeding, nerve injury,  blood clots, cardiopulmonary complications, morbidity, mortality, among others,  and they were willing to proceed.   Hiram Gash, MD  01/02/2019 12:48 PM

## 2019-01-02 NOTE — Anesthesia Postprocedure Evaluation (Signed)
Anesthesia Post Note  Patient: Heather Ray  Procedure(s) Performed: LEFT KNEE ARTHROSCOPY WITH PARTIAL MEDIAL MENISECTOMY AND PATELLAR CHONDROPLASTY (Left Knee)     Patient location during evaluation: PACU Anesthesia Type: General Level of consciousness: awake and alert Pain management: pain level controlled Vital Signs Assessment: post-procedure vital signs reviewed and stable Respiratory status: spontaneous breathing, nonlabored ventilation and respiratory function stable Cardiovascular status: blood pressure returned to baseline and stable Postop Assessment: no apparent nausea or vomiting Anesthetic complications: no    Last Vitals:  Vitals:   01/02/19 1430 01/02/19 1500  BP:  135/68  Pulse:  70  Resp: 18 18  Temp:  37 C  SpO2: 97% 100%    Last Pain:  Vitals:   01/02/19 1500  TempSrc:   PainSc: 4                  Catalina Gravel

## 2019-01-02 NOTE — Discharge Instructions (Signed)
°  Post Anesthesia Home Care Instructions  No Ibuprofen until after 730pm 01/02/19.  Activity: Get plenty of rest for the remainder of the day. A responsible individual must stay with you for 24 hours following the procedure.  For the next 24 hours, DO NOT: -Drive a car -Paediatric nurse -Drink alcoholic beverages -Take any medication unless instructed by your physician -Make any legal decisions or sign important papers.  Meals: Start with liquid foods such as gelatin or soup. Progress to regular foods as tolerated. Avoid greasy, spicy, heavy foods. If nausea and/or vomiting occur, drink only clear liquids until the nausea and/or vomiting subsides. Call your physician if vomiting continues.  Special Instructions/Symptoms: Your throat may feel dry or sore from the anesthesia or the breathing tube placed in your throat during surgery. If this causes discomfort, gargle with warm salt water. The discomfort should disappear within 24 hours.  If you had a scopolamine patch placed behind your ear for the management of post- operative nausea and/or vomiting:  1. The medication in the patch is effective for 72 hours, after which it should be removed.  Wrap patch in a tissue and discard in the trash. Wash hands thoroughly with soap and water. 2. You may remove the patch earlier than 72 hours if you experience unpleasant side effects which may include dry mouth, dizziness or visual disturbances. 3. Avoid touching the patch. Wash your hands with soap and water after contact with the patch.

## 2019-01-02 NOTE — Anesthesia Preprocedure Evaluation (Addendum)
Anesthesia Evaluation  Patient identified by MRN, date of birth, ID band Patient awake    Reviewed: Allergy & Precautions, NPO status , Patient's Chart, lab work & pertinent test results  History of Anesthesia Complications Negative for: history of anesthetic complications  Airway Mallampati: II  TM Distance: >3 FB Neck ROM: Full    Dental  (+) Teeth Intact, Dental Advisory Given   Pulmonary neg pulmonary ROS,    Pulmonary exam normal breath sounds clear to auscultation       Cardiovascular Exercise Tolerance: Good negative cardio ROS Normal cardiovascular exam Rhythm:Regular Rate:Normal     Neuro/Psych negative neurological ROS     GI/Hepatic negative GI ROS, Neg liver ROS,   Endo/Other  negative endocrine ROS  Renal/GU negative Renal ROS     Musculoskeletal LEFT MEDIAL MENISCUS TEAR   Abdominal   Peds  Hematology negative hematology ROS (+)   Anesthesia Other Findings Day of surgery medications reviewed with the patient.  Reproductive/Obstetrics negative OB ROS                            Anesthesia Physical Anesthesia Plan  ASA: I  Anesthesia Plan: General   Post-op Pain Management:    Induction: Intravenous  PONV Risk Score and Plan: 4 or greater and Midazolam, Dexamethasone, Ondansetron and Scopolamine patch - Pre-op  Airway Management Planned: LMA  Additional Equipment:   Intra-op Plan:   Post-operative Plan: Extubation in OR  Informed Consent: I have reviewed the patients History and Physical, chart, labs and discussed the procedure including the risks, benefits and alternatives for the proposed anesthesia with the patient or authorized representative who has indicated his/her understanding and acceptance.     Dental advisory given  Plan Discussed with: CRNA  Anesthesia Plan Comments:         Anesthesia Quick Evaluation

## 2019-01-05 ENCOUNTER — Encounter (HOSPITAL_BASED_OUTPATIENT_CLINIC_OR_DEPARTMENT_OTHER): Payer: Self-pay | Admitting: Orthopaedic Surgery

## 2019-01-08 DIAGNOSIS — S83242D Other tear of medial meniscus, current injury, left knee, subsequent encounter: Secondary | ICD-10-CM | POA: Diagnosis not present

## 2019-01-14 ENCOUNTER — Encounter: Payer: Self-pay | Admitting: Physical Therapy

## 2019-01-14 ENCOUNTER — Ambulatory Visit: Payer: 59 | Attending: Orthopaedic Surgery | Admitting: Physical Therapy

## 2019-01-14 DIAGNOSIS — M25662 Stiffness of left knee, not elsewhere classified: Secondary | ICD-10-CM | POA: Insufficient documentation

## 2019-01-14 DIAGNOSIS — R262 Difficulty in walking, not elsewhere classified: Secondary | ICD-10-CM | POA: Insufficient documentation

## 2019-01-14 DIAGNOSIS — M25562 Pain in left knee: Secondary | ICD-10-CM | POA: Insufficient documentation

## 2019-01-14 NOTE — Patient Instructions (Signed)
Access Code: [email or web address]  URL: [email or web address]  Date: 01/14/2019  Prepared by: Elsie Ra   Exercises  Supine Hamstring Stretch with Strap - 3 sets - 30 hold - 2x daily - 6x weekly  Supine Heel Slide with Strap - 10 reps - 2-3 sets - 5 hold - 2x daily - 6x weekly  Supine Quad Set - 10 reps - 2-3 sets - 5 sec hold - 2x daily - 6x weekly  Supine Active Straight Leg Raise - 10 reps - 1-3 sets - 2x daily - 6x weekly  Seated Long Arc Quad - 10 reps - 2-3 sets - 3 hold - 2x daily - 6x weekly  Supine Knee Extension Mobilization with Weight - 1 sets - 3-5 min hold - 2x daily - 6x weekly  Standing Knee Flexion - 10 reps - 3 sets - 2x daily - 6x weekly  Walking March - 10 reps - 3 sets - 2x daily - 6x weekly

## 2019-01-14 NOTE — Therapy (Signed)
[place], [place], [address] Phone: [phone]   Fax:  [phone]  Physical Therapy Evaluation  Patient Details  Name: Heather Ray MRN: [id number] Date of Birth: [birth year] Referring Provider (PT): Ophelia Charter, MD   Encounter Date: 01/14/2019  PT End of Session - 01/14/19 1922    Visit Number  1    Number of Visits  8    Date for PT Re-Evaluation  02/11/19    Authorization Type  [facility]    PT Start Time  0430    PT Stop Time  0517    PT Time Calculation (min)  47 min    Activity Tolerance  Patient tolerated treatment well    Behavior During Therapy  [facility] for tasks assessed/performed       Past Medical History:  Diagnosis Date  . Fibroids     Past Surgical History:  Procedure Laterality Date  . CESAREAN SECTION    . KNEE ARTHROPLASTY    . KNEE ARTHROSCOPY WITH MEDIAL MENISECTOMY Left 01/02/2019   Procedure: LEFT KNEE ARTHROSCOPY WITH PARTIAL MEDIAL MENISECTOMY AND PATELLAR CHONDROPLASTY;  Surgeon: Hiram Gash, MD;  Location: [place];  Service: Orthopedics;  Laterality: Left;    There were no vitals filed for this visit.   Subjective Assessment - 01/14/19 1909    Subjective  Pt had L Knee Partial Medical Meniscectomy & patellar chondroplasty 01/02/19,. She now has complaints of knee pain and stiffness and difficulty with stairs and walking. She is WBAT    Pertinent History  no significant PMH    Limitations  Standing;Walking;House hold activities    How long can you sit comfortably?  not limited    How long can you stand comfortably?  10 min    How long can you walk comfortably?  one block    Diagnostic tests  MRI confirmed medial meniscus tear    Patient Stated Goals  walk normal, get my knee back to normal    Currently in Pain?  Yes    Pain Score  5     Pain Location  Knee    Pain Orientation  Left    Pain Descriptors / Indicators  Aching;Sharp    Pain Type   Surgical pain    Pain Radiating Towards  denies    Pain Onset  1 to 4 weeks ago    Pain Frequency  Intermittent    Aggravating Factors   walking, stairs,    Pain Relieving Factors  rest, ice, NSAIDS    Multiple Pain Sites  No         [facility] PT Assessment - 01/14/19 0001      Assessment   Medical Diagnosis  L Knee Partial Medical Meniscectomy & patellar chondroplasty    Referring Provider (PT)  Ophelia Charter, MD    Onset Date/Surgical Date  01/02/19    Next MD Visit  3 weeks    Prior Therapy  none      Precautions   Precautions  None      Restrictions   Weight Bearing Restrictions  No      Balance Screen   Has the patient fallen in the past 6 months  No      [place] residence    Additional Comments  3 steps to enter      Prior Function   Level of Protection  Full time employment    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      Cognition   Overall Cognitive Status  Within Functional Limits for tasks assessed      Observation/Other Assessments   Focus on Therapeutic Outcomes (FOTO)   62%limited      Sensation   Light Touch  Appears Intact      Coordination   Gross Motor Movements are Fluid and Coordinated  Yes      ROM / Strength   AROM / PROM / Strength  AROM;Strength      AROM   AROM Assessment Site  Knee    Right/Left Knee  Right;Left    Right Knee Extension  0    Right Knee Flexion  125    Left Knee Extension  -5    Left Knee Flexion  110      Strength   Overall Strength Comments  4/5 MMT Lt knee, 4+/5 MMT Lt hip       Flexibility   Soft Tissue Assessment /Muscle Length  --   tightness in quads and H.S Lt     Palpation   Patella mobility  good mobiliy lateral glides, some hypomobility with inf-sup glides, no TTP reported, very little swelling, no redness or warmth present, incision site well healing      Transfers   Transfers  Independent with all Transfers       Ambulation/Gait   Gait Pattern  Step-through pattern;Decreased step length - left;Decreased stance time - left;Decreased hip/knee flexion - left;Decreased dorsiflexion - left;Decreased weight shift to left;Left flexed knee in stance    Gait Comments  gait pattern improved after gait traning today for march walking and heel toe walking to encourage more knee flexion during swing phase and more knee ext during stance phase                Objective measurements completed on examination: See above findings.      [facility] Adult PT Treatment/Exercise - 01/14/19 0001      Exercises   Exercises  Knee/Hip      Knee/Hip Exercises: Stretches   Passive Hamstring Stretch  Left;2 reps;30 seconds    Other Knee/Hip Stretches  heelslides with strap 5 sec X 15 reps      Knee/Hip Exercises: Standing   Knee Flexion  Left;10 reps    Other Standing Knee Exercises  march walk at counter and walk to overemphasize heel strike and toe off      Knee/Hip Exercises: Seated   Long Arc Quad  Left;10 reps      Knee/Hip Exercises: Supine   Quad Sets  Left;10 reps    Heel Prop for Knee Extension  3 minutes    Heel Prop for Knee Extension Limitations  during first 3 min of ice    Straight Leg Raises  10 reps;Left    Patellar Mobs  sup-inf X 10      Modalities   Modalities  Cryotherapy      Cryotherapy   Number Minutes Cryotherapy  10 Minutes    Cryotherapy Location  Knee    Type of Cryotherapy  Ice pack             PT Education - 01/14/19 1922    Education Details  HEP, POC, Ice, gait training    Person(s) Educated  Patient    Methods  Explanation;Demonstration;Verbal cues;Handout    Comprehension  Verbalized understanding;Returned demonstration;Need further instruction  PT Long Term Goals - 01/14/19 1929      PT LONG TERM GOAL #1   Title  Pt will be I and compliant with HEP. 4 weeks 02/11/19    Status  New      PT LONG TERM GOAL #2   Title  Pt will increase Lt knee AROM  to 0-125 deg to improve function. 4 weeks 02/11/19    Status  New      PT LONG TERM GOAL #3   Title  Pt will improve Lt LE strength to 5/5 MMT. 4 weeks 02/11/19    Status  New      PT LONG TERM GOAL #4   Title  Pt will improve FOTO to less than 41% limited. 4 weeks 02/11/19    Status  New      PT LONG TERM GOAL #5   Title  Pt will show WNL gait pattern for community ambulation and on uneven surfaces and up/down stairs reciprocally all with less than 2/10 knee pain. 4 weeks 02/11/19    Status  New             Plan - 01/14/19 1923    Clinical Impression Statement  Pt presents with Lt Knee, weakness and stiffness S/P Partial Medical Meniscectomy & patellar chondroplasty 01/02/19. She is WBAT and overall doing pretty well. She does have stiff gait with decreased knee flexion during swing phase and decreased knee ext with stance phase but his greatly improved after gait training today. She will benefit from skilled PT to address her defecits.     Clinical Presentation  Stable    Clinical Decision Making  Low    Rehab Potential  Excellent    Clinical Impairments Affecting Rehab Potential  has to walk a lot at work    PT Frequency  2x / week    PT Duration  4 weeks    PT Treatment/Interventions  Cryotherapy;Electrical Stimulation;Iontophoresis 4mg /ml Dexamethasone;Moist Heat;Ultrasound;Therapeutic exercise;Neuromuscular re-education;Therapeutic activities;Manual techniques;Passive range of motion;Dry needling;Taping;Joint Manipulations    PT Next Visit Plan  review HEP, MT and PROM for patella mobs and knee ROM, gait training, hip/knee strength, progress to stair training    PT Home Exercise Plan  HSS, quad stretch, heel slides, quad set, LAQ, st H.S curl, march walk    Consulted and Agree with Plan of Care  Patient       Patient will benefit from skilled therapeutic intervention in order to improve the following deficits and impairments:  Abnormal gait, Decreased activity tolerance, Decreased  range of motion, Decreased strength, Decreased endurance, Difficulty walking, Impaired flexibility, Pain  Visit Diagnosis: Acute pain of left knee  Stiffness of left knee, not elsewhere classified  Difficulty in walking, not elsewhere classified     Problem List There are no active problems to display for this patient.   [name] 01/14/2019, 7:32 PM  [place], [place], [address] Phone: [phone]   Fax:  [phone]  Name: Heather Ray MRN: [id number] Date of Birth: 1971/01/03

## 2019-01-16 ENCOUNTER — Encounter

## 2019-01-20 ENCOUNTER — Encounter: Payer: Self-pay | Admitting: Physical Therapy

## 2019-01-20 ENCOUNTER — Ambulatory Visit: Payer: 59 | Admitting: Physical Therapy

## 2019-01-20 DIAGNOSIS — R262 Difficulty in walking, not elsewhere classified: Secondary | ICD-10-CM

## 2019-01-20 DIAGNOSIS — M25662 Stiffness of left knee, not elsewhere classified: Secondary | ICD-10-CM | POA: Diagnosis not present

## 2019-01-20 DIAGNOSIS — M25562 Pain in left knee: Secondary | ICD-10-CM | POA: Diagnosis not present

## 2019-01-20 NOTE — Patient Instructions (Signed)
Hip Flexor Stretch    Lying on back near edge of bed, bend one leg, foot flat. Hang other leg over edge, relaxed, thigh resting entirely on bed for ____30 seconds. Repeat __3__ times. Do __1__ sessions per day. Advanced Exercise: Bend knee back keeping thigh in contact with bed.  http://gt2.exer.us/347   Copyright  [employer]. All rights reserved.

## 2019-01-20 NOTE — Therapy (Signed)
[place], [place], [address] Phone: [phone]   Fax:  [phone]  Physical Therapy Treatment  Patient Details  Name: Heather Ray MRN: [id number] Date of Birth: 08/17/1971 Referring Provider (PT): Ophelia Charter, MD   Encounter Date: 01/20/2019  PT End of Session - 01/20/19 0851    Visit Number  2    Number of Visits  8    Date for PT Re-Evaluation  02/11/19    Authorization Type  [facility]    PT Start Time  0807    PT Stop Time  0845    PT Time Calculation (min)  38 min    Activity Tolerance  Patient tolerated treatment well    Behavior During Therapy  [facility] for tasks assessed/performed       Past Medical History:  Diagnosis Date  . Fibroids     Past Surgical History:  Procedure Laterality Date  . CESAREAN SECTION    . KNEE ARTHROPLASTY    . KNEE ARTHROSCOPY WITH MEDIAL MENISECTOMY Left 01/02/2019   Procedure: LEFT KNEE ARTHROSCOPY WITH PARTIAL MEDIAL MENISECTOMY AND PATELLAR CHONDROPLASTY;  Surgeon: Hiram Gash, MD;  Location: [place];  Service: Orthopedics;  Laterality: Left;    There were no vitals filed for this visit.  Subjective Assessment - 01/20/19 0812    Subjective  Knee gave away  walking last night.  i have done the exrecises and have been working on walking.  i can't dependd on the leg.    i feels loike it is healing    Currently in Pain?  No/denies    Pain Location  Knee    Pain Orientation  Left    Pain Descriptors / Indicators  Aching    Pain Frequency  Intermittent    Aggravating Factors   after being on it all day,  end range flexion    Pain Relieving Factors  rest,  ice after working out         [facility] PT Assessment - 01/20/19 0001      AROM   Left Knee Flexion  136                   [facility] Adult PT Treatment/Exercise - 01/20/19 0001      Self-Care   Self-Care  Other Self-Care Comments    Other Self-Care Comments   how to work scar       Knee/Hip Exercises: Stretches   Passive Hamstring Stretch  3 reps;30 seconds;Left    Hip Flexor Stretch  Left;3 reps;30 seconds    Hip Flexor Stretch Limitations  ROM increased  hip extension      Knee/Hip Exercises: Standing   Gait Training  YOGA walking  gait improved.  less pounding      Knee/Hip Exercises: Seated   Long Arc Quad  10 reps    Long Arc Quad Limitations  90 to 40 degrees       Knee/Hip Exercises: Supine   Quad Sets  20 reps    Quad Sets Limitations  lateral tracking    Heel Slides  20 reps;Left    Patellar Mobs  sup-inf X 10 also side to side      Knee/Hip Exercises: Prone   Hip Extension  10 reps    Other Prone Exercises  quad set prone 10 X 3 seconds              PT Education - 01/20/19 0037  Education Details  Self care,   HEP    Person(s) Educated  Patient    Methods  Explanation;Demonstration;Tactile cues;Verbal cues;Handout    Comprehension  Returned demonstration          PT Long Term Goals - 01/14/19 1929      PT LONG TERM GOAL #1   Title  Pt will be I and compliant with HEP. 4 weeks 02/11/19    Status  New      PT LONG TERM GOAL #2   Title  Pt will increase Lt knee AROM to 0-125 deg to improve function. 4 weeks 02/11/19    Status  New      PT LONG TERM GOAL #3   Title  Pt will improve Lt LE strength to 5/5 MMT. 4 weeks 02/11/19    Status  New      PT LONG TERM GOAL #4   Title  Pt will improve FOTO to less than 41% limited. 4 weeks 02/11/19    Status  New      PT LONG TERM GOAL #5   Title  Pt will show WNL gait pattern for community ambulation and on uneven surfaces and up/down stairs reciprocally all with less than 2/10 knee pain. 4 weeks 02/11/19    Status  New            Plan - 01/20/19 0854    Clinical Impression Statement  138  AROM .  Able to progress HEP .  No pain at end of session.     PT Next Visit Plan  review HEP, MT and PROM for patella mobs and knee ROM, gait training, hip/knee strength, progress to stair  training    PT Home Exercise Plan  HSS, quad stretch, heel slides, quad set, LAQ, st H.S curl, march walk  Hip flexion stretch.    Consulted and Agree with Plan of Care  Patient       Patient will benefit from skilled therapeutic intervention in order to improve the following deficits and impairments:     Visit Diagnosis: Acute pain of left knee  Stiffness of left knee, not elsewhere classified  Difficulty in walking, not elsewhere classified     Problem List There are no active problems to display for this patient.   Heather Ray  PTA 01/20/2019, 9:01 AM  [facility] [address] [place], [place], [address] Phone: [phone]   Fax:  [phone]  Name: Heather Ray MRN: [id number] Date of Birth: 1971/08/11

## 2019-01-23 ENCOUNTER — Ambulatory Visit: Payer: 59 | Admitting: Physical Therapy

## 2019-01-23 ENCOUNTER — Encounter: Payer: Self-pay | Admitting: Physical Therapy

## 2019-01-23 DIAGNOSIS — R262 Difficulty in walking, not elsewhere classified: Secondary | ICD-10-CM

## 2019-01-23 DIAGNOSIS — M25662 Stiffness of left knee, not elsewhere classified: Secondary | ICD-10-CM

## 2019-01-23 DIAGNOSIS — M25562 Pain in left knee: Secondary | ICD-10-CM

## 2019-01-23 NOTE — Therapy (Signed)
[place], [place], [address] Phone: [phone]   Fax:  [phone]  Physical Therapy Treatment  Patient Details  Name: Heather Ray MRN: [id number] Date of Birth: 03/08/1971 Referring Provider (PT): Ophelia Charter, MD   Encounter Date: 01/23/2019  PT End of Session - 01/23/19 0950    Visit Number  3    Number of Visits  8    Date for PT Re-Evaluation  02/11/19    Authorization Type  [facility]    PT Start Time  0849    PT Stop Time  0940    PT Time Calculation (min)  51 min    Activity Tolerance  Patient tolerated treatment well    Behavior During Therapy  [facility] for tasks assessed/performed       Past Medical History:  Diagnosis Date  . Fibroids     Past Surgical History:  Procedure Laterality Date  . CESAREAN SECTION    . KNEE ARTHROPLASTY    . KNEE ARTHROSCOPY WITH MEDIAL MENISECTOMY Left 01/02/2019   Procedure: LEFT KNEE ARTHROSCOPY WITH PARTIAL MEDIAL MENISECTOMY AND PATELLAR CHONDROPLASTY;  Surgeon: Hiram Gash, MD;  Location: [place];  Service: Orthopedics;  Laterality: Left;    There were no vitals filed for this visit.  Subjective Assessment - 01/23/19 0854    Subjective  Pt arriving reporting no pain at beginning of session. Pt did report feelings of knee buckeling during walking and achiness at night.     Pertinent History  no significant PMH    Limitations  Standing;Walking;House hold activities    How long can you sit comfortably?  not limited    How long can you stand comfortably?  10 min    How long can you walk comfortably?  one block    Diagnostic tests  MRI confirmed medial meniscus tear    Patient Stated Goals  walk normal, get my knee back to normal    Currently in Pain?  No/denies         [facility] PT Assessment - 01/23/19 0001      AROM   AROM Assessment Site  Knee    Right/Left Knee  Right    Right Knee Extension  0    Right Knee Flexion  135    Left Knee Extension  -3    Left Knee Flexion  136                   [facility] Adult PT Treatment/Exercise - 01/23/19 0001      Knee/Hip Exercises: Stretches   Active Hamstring Stretch  Left;3 reps;30 seconds    Hip Flexor Stretch  Left;2 reps;30 seconds      Knee/Hip Exercises: Aerobic   Stationary Bike  6 minutes L2 resistance      Knee/Hip Exercises: Standing   Lateral Step Up  Left;10 reps;Hand Hold: 1;Step Height: 4"    Forward Step Up  Left;10 reps;Hand Hold: 1;Step Height: 4"    Step Down  Left;10 reps;Hand Hold: 1;Step Height: 4"      Knee/Hip Exercises: Supine   Quad Sets  20 reps    Quad Sets Limitations  lateral tracking    Heel Slides  20 reps;Left    Bridges  AROM;Both;15 reps    Bridges with Greig Right  Strengthening;Both;5 reps    Straight Leg Raises  10 reps    Straight Leg Raises Limitations  3#    Patellar Mobs  sup-inf  X 10 also side to side      Knee/Hip Exercises: Prone   Hip Extension  10 reps      Modalities   Modalities  Cryotherapy      Cryotherapy   Number Minutes Cryotherapy  10 Minutes    Cryotherapy Location  Knee    Type of Cryotherapy  Ice pack      Manual Therapy   Manual therapy comments  patella mobs and soft tissue moblilzation L knee, IT band,                   PT Long Term Goals - 01/23/19 0900      PT LONG TERM GOAL #1   Title  Pt will be I and compliant with HEP. 4 weeks 02/11/19    Status  On-going      PT LONG TERM GOAL #2   Title  Pt will increase Lt knee AROM to 0-125 deg to improve function. 4 weeks 02/11/19    Status  On-going      PT LONG TERM GOAL #3   Title  Pt will improve Lt LE strength to 5/5 MMT. 4 weeks 02/11/19    Status  New      PT LONG TERM GOAL #4   Title  Pt will improve FOTO to less than 41% limited. 4 weeks 02/11/19    Status  New      PT LONG TERM GOAL #5   Title  Pt will show WNL gait pattern for community ambulation and on uneven surfaces and up/down stairs reciprocally all with  less than 2/10 knee pain. 4 weeks 02/11/19    Status  New            Plan - 01/23/19 0856    Clinical Impression Statement  Pt tolerating exercises well. Continue PT POC and progress toward goals set. AROM measured today 3-136 degrees in L knee. Pt still reporting feelings of locking with stair climbing and buckeling episodes at home, but reports that they are a little less frequent. No pain reported at end of session.     Clinical Presentation  Stable    Rehab Potential  Excellent    Clinical Impairments Affecting Rehab Potential  has to walk a lot at work    PT Frequency  2x / week    PT Duration  4 weeks    PT Treatment/Interventions  Cryotherapy;Electrical Stimulation;Iontophoresis 4mg /ml Dexamethasone;Moist Heat;Ultrasound;Therapeutic exercise;Neuromuscular re-education;Therapeutic activities;Manual techniques;Passive range of motion;Dry needling;Taping;Joint Manipulations    PT Next Visit Plan  review HEP, MT and PROM for patella mobs and knee ROM, gait training, hip/knee strength, progress to stair training    PT Home Exercise Plan  HSS, quad stretch, heel slides, quad set, LAQ, st H.S curl, march walk  Hip flexion stretch.    Consulted and Agree with Plan of Care  Patient       Patient will benefit from skilled therapeutic intervention in order to improve the following deficits and impairments:  Abnormal gait, Decreased activity tolerance, Decreased range of motion, Decreased strength, Decreased endurance, Difficulty walking, Impaired flexibility, Pain  Visit Diagnosis: Acute pain of left knee  Stiffness of left knee, not elsewhere classified  Difficulty in walking, not elsewhere classified     Problem List There are no active problems to display for this patient.   Oretha Caprice, PT 01/23/2019, 9:53 AM  [facility] [address] [place], [place], [address] Phone: [phone]   Fax:  [phone]  Name: Heather Ray MRN: [id number] Date of Birth: [birth year]

## 2019-01-28 ENCOUNTER — Encounter: Payer: 59 | Admitting: Physical Therapy

## 2019-01-30 ENCOUNTER — Ambulatory Visit: Payer: 59 | Admitting: Physical Therapy

## 2019-01-30 DIAGNOSIS — M25562 Pain in left knee: Secondary | ICD-10-CM | POA: Diagnosis not present

## 2019-01-30 DIAGNOSIS — M25662 Stiffness of left knee, not elsewhere classified: Secondary | ICD-10-CM

## 2019-01-30 DIAGNOSIS — R262 Difficulty in walking, not elsewhere classified: Secondary | ICD-10-CM | POA: Diagnosis not present

## 2019-01-30 NOTE — Therapy (Signed)
[place], [place], [address] Phone: [phone]   Fax:  [phone]  Physical Therapy Treatment  Patient Details  Name: Heather Ray MRN: [id number] Date of Birth: 1971-03-05 Referring Provider (PT): Ophelia Charter, MD   Encounter Date: 01/30/2019  PT End of Session - 01/30/19 0941    Visit Number  4    Number of Visits  8    Date for PT Re-Evaluation  02/11/19    Authorization Type  [facility] [employer]    PT Start Time  0847    PT Stop Time  0947   last 10 min on ice   PT Time Calculation (min)  60 min    Activity Tolerance  Patient tolerated treatment well    Behavior During Therapy  [facility] for tasks assessed/performed       Past Medical History:  Diagnosis Date  . Fibroids     Past Surgical History:  Procedure Laterality Date  . CESAREAN SECTION    . KNEE ARTHROPLASTY    . KNEE ARTHROSCOPY WITH MEDIAL MENISECTOMY Left 01/02/2019   Procedure: LEFT KNEE ARTHROSCOPY WITH PARTIAL MEDIAL MENISECTOMY AND PATELLAR CHONDROPLASTY;  Surgeon: Hiram Gash, MD;  Location: [place];  Service: Orthopedics;  Laterality: Left;    There were no vitals filed for this visit.  Subjective Assessment - 01/30/19 0937    Subjective  Pt relays knee is doing better she still has some pain and stiffness in the morning and some pain with stairs    Currently in Pain?  Yes    Pain Score  2     Pain Location  Knee    Pain Orientation  Left    Pain Descriptors / Indicators  Aching    Pain Type  Surgical pain    Pain Onset  1 to 4 weeks ago                       [facility] Adult PT Treatment/Exercise - 01/30/19 0001      Knee/Hip Exercises: Stretches   Active Hamstring Stretch  Left;3 reps;30 seconds    Hip Flexor Stretch  Left;2 reps;30 seconds    ITB Stretch  Left;2 reps;30 seconds      Knee/Hip Exercises: Aerobic   Stationary Bike  5 minutes L2 resistance      Knee/Hip Exercises: Standing    Terminal Knee Extension  Left;20 reps;Theraband    Theraband Level (Terminal Knee Extension)  Level 3 (Green)    Forward Step Up  Left;Hand Hold: 0;Step Height: 6";10 reps    Step Down  Left;Hand Hold: 0;Step Height: 6"    Wall Squat  15 reps;5 seconds    Wall Squat Limitations  against Pball with ball sq      Knee/Hip Exercises: Seated   Long Arc Quad  Left;20 reps    Long Arc Quad Weight  5 lbs.    Long [employer] Limitations  with ball sq    Hamstring Curl  Left;20 reps    Hamstring Limitations  gren band      Knee/Hip Exercises: Supine   Bridges  10 reps    Bridges Limitations  10 sec with ball squeeze    Straight Leg Raises  Left;15 reps    Straight Leg Raises Limitations  3#      Knee/Hip Exercises: Sidelying   Hip ABduction  Left;15 reps    Hip ABduction Limitations  2 lbs  Knee/Hip Exercises: Prone   Hip Extension  Left;15 reps    Hip Extension Limitations  2lbs      Modalities   Modalities  Cryotherapy      Cryotherapy   Number Minutes Cryotherapy  10 Minutes    Cryotherapy Location  Knee    Type of Cryotherapy  Ice pack      Manual Therapy   Manual therapy comments  patella mobs and soft tissue moblilzation L knee, IT band, MET contract relax for H.S, and also knee extension mobs                  PT Long Term Goals - 01/23/19 0900      PT LONG TERM GOAL #1   Title  Pt will be I and compliant with HEP. 4 weeks 02/11/19    Status  On-going      PT LONG TERM GOAL #2   Title  Pt will increase Lt knee AROM to 0-125 deg to improve function. 4 weeks 02/11/19    Status  On-going      PT LONG TERM GOAL #3   Title  Pt will improve Lt LE strength to 5/5 MMT. 4 weeks 02/11/19    Status  New      PT LONG TERM GOAL #4   Title  Pt will improve FOTO to less than 41% limited. 4 weeks 02/11/19    Status  New      PT LONG TERM GOAL #5   Title  Pt will show WNL gait pattern for community ambulation and on uneven surfaces and up/down stairs reciprocally all with  less than 2/10 knee pain. 4 weeks 02/11/19    Status  New            Plan - 01/30/19 0942    Clinical Impression Statement  Pt able to progress her therex today with focus on TKE and activating more hip adduction to correct lateral tracking. She was also given IT band stretch as well due to lateral tracking of patella. She had good tolerance to therex and had less pain with step ups today and was also able to use taller step. She is overall progressing well with PT    Rehab Potential  Excellent    Clinical Impairments Affecting Rehab Potential  has to walk a lot at work    PT Frequency  2x / week    PT Duration  4 weeks    PT Treatment/Interventions  Cryotherapy;Electrical Stimulation;Iontophoresis 26m/ml Dexamethasone;Moist Heat;Ultrasound;Therapeutic exercise;Neuromuscular re-education;Therapeutic activities;Manual techniques;Passive range of motion;Dry needling;Taping;Joint Manipulations    PT Next Visit Plan  MT and PROM for patella mobs and knee ext mobs, hip/knee strength, progress to stair training, consider elliptical    PT Home Exercise Plan  HSS, quad stretch, heel slides, quad set, LAQ, st H.S curl, march walk  Hip flexion stretch.    Consulted and Agree with Plan of Care  Patient       Patient will benefit from skilled therapeutic intervention in order to improve the following deficits and impairments:  Abnormal gait, Decreased activity tolerance, Decreased range of motion, Decreased strength, Decreased endurance, Difficulty walking, Impaired flexibility, Pain  Visit Diagnosis: Acute pain of left knee  Stiffness of left knee, not elsewhere classified  Difficulty in walking, not elsewhere classified     Problem List There are no active problems to display for this patient.   BSilvestre Mesi2/21/2020, 9:45 AM  [facility]  [place], [place], [address] Phone: [phone]   Fax:   [phone]  Name: Heather Ray MRN: [id number] Date of Birth: 07/25/1971

## 2019-02-02 ENCOUNTER — Ambulatory Visit: Payer: 59 | Admitting: Physical Therapy

## 2019-02-02 DIAGNOSIS — R262 Difficulty in walking, not elsewhere classified: Secondary | ICD-10-CM

## 2019-02-02 DIAGNOSIS — M25662 Stiffness of left knee, not elsewhere classified: Secondary | ICD-10-CM | POA: Diagnosis not present

## 2019-02-02 DIAGNOSIS — M25562 Pain in left knee: Secondary | ICD-10-CM

## 2019-02-02 NOTE — Therapy (Signed)
[place], [place], [address] Phone: [phone]   Fax:  [phone]  Physical Therapy Treatment  Patient Details  Name: Heather Ray MRN: [id number] Date of Birth: July 25, 1971 Referring Provider (PT): Ophelia Charter, MD   Encounter Date: 02/02/2019  PT End of Session - 02/02/19 2247    Visit Number  5    Number of Visits  8    Date for PT Re-Evaluation  02/11/19    Authorization Type  [facility]    PT Start Time  0515    PT Stop Time  2542   last 10 on ice   PT Time Calculation (min)  55 min    Activity Tolerance  Patient tolerated treatment well    Behavior During Therapy  [facility] for tasks assessed/performed       Past Medical History:  Diagnosis Date  . Fibroids     Past Surgical History:  Procedure Laterality Date  . CESAREAN SECTION    . KNEE ARTHROPLASTY    . KNEE ARTHROSCOPY WITH MEDIAL MENISECTOMY Left 01/02/2019   Procedure: LEFT KNEE ARTHROSCOPY WITH PARTIAL MEDIAL MENISECTOMY AND PATELLAR CHONDROPLASTY;  Surgeon: Hiram Gash, MD;  Location: [place];  Service: Orthopedics;  Laterality: Left;    There were no vitals filed for this visit.  Subjective Assessment - 02/02/19 2242    Subjective  Pt relays some knee soreness after being on her feet a lot yesterday in dress shoes.    Currently in Pain?  Yes    Pain Score  3     Pain Location  Knee    Pain Orientation  Left    Pain Descriptors / Indicators  Aching    Pain Type  Surgical pain                       [facility] Adult PT Treatment/Exercise - 02/02/19 0001      Knee/Hip Exercises: Stretches   Active Hamstring Stretch  Left;3 reps;30 seconds    Hip Flexor Stretch  Left;2 reps;30 seconds    ITB Stretch  Left;30 seconds;3 reps      Knee/Hip Exercises: Aerobic   Stationary Bike  5 minutes L2 resistance      Knee/Hip Exercises: Standing   Forward Lunges Limitations  1/2 lunges with TRX X 10 bilat    Terminal Knee Extension  Left;20 reps;Theraband    Theraband Level (Terminal Knee Extension)  Level 4 (Blue)    Forward Step Up  Left;Hand Hold: 0;Step Height: 6";10 reps    Forward Step Up Limitations  with march    Functional Squat Limitations  TRX squats with ball sq 2X10      Knee/Hip Exercises: Seated   Long Arc Quad  Left;20 reps    Long Arc Quad Weight  5 lbs.    Long [employer] Limitations  with ball sq    Hamstring Curl  Left;20 reps    Hamstring Limitations  gren band      Knee/Hip Exercises: Supine   Bridges  10 reps    Bridges Limitations  with ball sq and alt knee ext      Modalities   Modalities  Cryotherapy      Cryotherapy   Number Minutes Cryotherapy  10 Minutes   5 min with heel prop for ext, 5 min elevated   Cryotherapy Location  Knee    Type of Cryotherapy  Ice pack  Manual Therapy   Manual therapy comments  patella mobs and soft tissue moblilzation L knee, IT band, MET contract relax for H.S, and also knee extension mobs                  PT Long Term Goals - 01/23/19 0900      PT LONG TERM GOAL #1   Title  Pt will be I and compliant with HEP. 4 weeks 02/11/19    Status  On-going      PT LONG TERM GOAL #2   Title  Pt will increase Lt knee AROM to 0-125 deg to improve function. 4 weeks 02/11/19    Status  On-going      PT LONG TERM GOAL #3   Title  Pt will improve Lt LE strength to 5/5 MMT. 4 weeks 02/11/19    Status  New      PT LONG TERM GOAL #4   Title  Pt will improve FOTO to less than 41% limited. 4 weeks 02/11/19    Status  New      PT LONG TERM GOAL #5   Title  Pt will show WNL gait pattern for community ambulation and on uneven surfaces and up/down stairs reciprocally all with less than 2/10 knee pain. 4 weeks 02/11/19    Status  New            Plan - 02/02/19 2247    Clinical Impression Statement  Session focused on knee strength and knee ext ROM. She does still lack minor knee ext ROM. She was able to peform step ups today  without any pain or discomfort. PT will continue to progress toward functional goals.     Rehab Potential  Excellent    Clinical Impairments Affecting Rehab Potential  has to walk a lot at work    PT Frequency  2x / week    PT Duration  4 weeks    PT Treatment/Interventions  Cryotherapy;Electrical Stimulation;Iontophoresis 75m/ml Dexamethasone;Moist Heat;Ultrasound;Therapeutic exercise;Neuromuscular re-education;Therapeutic activities;Manual techniques;Passive range of motion;Dry needling;Taping;Joint Manipulations    PT Next Visit Plan  MT and PROM for patella mobs and knee ext mobs, hip/knee strength, progress to stair training, consider elliptical    PT Home Exercise Plan  HSS, quad stretch, heel slides, quad set, LAQ, st H.S curl, march walk  Hip flexion stretch.    Consulted and Agree with Plan of Care  Patient       Patient will benefit from skilled therapeutic intervention in order to improve the following deficits and impairments:  Abnormal gait, Decreased activity tolerance, Decreased range of motion, Decreased strength, Decreased endurance, Difficulty walking, Impaired flexibility, Pain  Visit Diagnosis: Acute pain of left knee  Stiffness of left knee, not elsewhere classified  Difficulty in walking, not elsewhere classified     Problem List There are no active problems to display for this patient.   BSilvestre Mesi2/24/2020, 10:50 PM  [place] [address]Phone: [phone]  Fax:  [phone] Name: Heather CAVENAUGHMRN: [id number] of Birth: 312-29-1972

## 2019-02-04 ENCOUNTER — Ambulatory Visit: Payer: 59 | Admitting: Physical Therapy

## 2019-02-04 DIAGNOSIS — M25562 Pain in left knee: Secondary | ICD-10-CM | POA: Diagnosis not present

## 2019-02-04 DIAGNOSIS — M25662 Stiffness of left knee, not elsewhere classified: Secondary | ICD-10-CM

## 2019-02-04 DIAGNOSIS — R262 Difficulty in walking, not elsewhere classified: Secondary | ICD-10-CM

## 2019-02-05 NOTE — Therapy (Signed)
[place], [place], [address] Phone: [phone]   Fax:  [phone]  Physical Therapy Treatment  Patient Details  Name: Heather Ray MRN: [id number] Date of Birth: 10-Aug-1971 Referring Provider (PT): Heather Charter, MD   Encounter Date: 02/04/2019  PT End of Session - 02/05/19 0713    Visit Number  6    Number of Visits  8    Date for PT Re-Evaluation  02/11/19    Authorization Type  [facility]    PT Start Time  8366    PT Stop Time  1810   last 10 min on ice   PT Time Calculation (min)  55 min    Activity Tolerance  Patient tolerated treatment well    Behavior During Therapy  [facility] for tasks assessed/performed       Past Medical History:  Diagnosis Date  . Fibroids     Past Surgical History:  Procedure Laterality Date  . CESAREAN SECTION    . KNEE ARTHROPLASTY    . KNEE ARTHROSCOPY WITH MEDIAL MENISECTOMY Left 01/02/2019   Procedure: LEFT KNEE ARTHROSCOPY WITH PARTIAL MEDIAL MENISECTOMY AND PATELLAR CHONDROPLASTY;  Surgeon: Hiram Gash, MD;  Location: [place];  Service: Orthopedics;  Laterality: Left;    There were no vitals filed for this visit.  Subjective Assessment - 02/04/19 1707    Subjective  Pt relays no knee pain just some soreness    Currently in Pain?  No/denies                       [facility] Adult PT Treatment/Exercise - 02/05/19 0709      Knee/Hip Exercises: Stretches   Active Hamstring Stretch  Left;3 reps;30 seconds    Hip Flexor Stretch  Left;2 reps;30 seconds    ITB Stretch  Left;30 seconds;3 reps      Knee/Hip Exercises: Aerobic   Stationary Bike  --    Elliptical  3 min fwd, 3 min back, no resist      Knee/Hip Exercises: Machines for Strengthening   Cybex Knee Extension  10 lbs x20 bilat then 5 lbs Lt leg all 20 reps    Cybex Knee Flexion  25 lbs X 20    Cybex Leg Press  55 lbs X 20 bilat then 35 lbs X 20 Lt      Knee/Hip Exercises:  Standing   Forward Lunges Limitations  1/2 lunges with TRX X 10 bilat    Terminal Knee Extension  --    Theraband Level (Terminal Knee Extension)  --    Lateral Step Up  Both;15 reps;Hand Hold: 0;Step Height: 8"    Forward Step Up  Left;Hand Hold: 0;15 reps;Step Height: 8"    Forward Step Up Limitations  with march    Functional Squat Limitations  TRX squats with ball sq 2X10      Knee/Hip Exercises: Seated   Long Arc Quad  --    Long Arc [employer]  --    Long Arc Quad Limitations  --    Hamstring Curl  --    Hamstring Limitations  --      Knee/Hip Exercises: Supine   Bridges  20 reps    Bridges Limitations  feet on ball, then 10 reps with hamstring curl      Modalities   Modalities  Cryotherapy      Cryotherapy   Number Minutes Cryotherapy  10 Minutes   heel  prop first 5 min   Cryotherapy Location  Knee      Manual Therapy   Manual therapy comments  --             PT Education - 02/05/19 0713    Education Details  Updated HEP    Person(s) Educated  Patient    Methods  Explanation;Demonstration;Verbal cues;Handout    Comprehension  Verbalized understanding;Returned demonstration          PT Long Term Goals - 01/23/19 0900      PT LONG TERM GOAL #1   Title  Pt will be I and compliant with HEP. 4 weeks 02/11/19    Status  On-going      PT LONG TERM GOAL #2   Title  Pt will increase Lt knee AROM to 0-125 deg to improve function. 4 weeks 02/11/19    Status  On-going      PT LONG TERM GOAL #3   Title  Pt will improve Lt LE strength to 5/5 MMT. 4 weeks 02/11/19    Status  New      PT LONG TERM GOAL #4   Title  Pt will improve FOTO to less than 41% limited. 4 weeks 02/11/19    Status  New      PT LONG TERM GOAL #5   Title  Pt will show WNL gait pattern for community ambulation and on uneven surfaces and up/down stairs reciprocally all with less than 2/10 knee pain. 4 weeks 02/11/19    Status  New            Plan - 02/05/19 0715    Clinical Impression  Statement  Pt has made excellent progress in all areas and PT anctipates one more week of PT then discharge due to progress. She was able to go up/down taller steps today without pain. Her HEP was updated to reflect her progression as well.     Rehab Potential  Excellent    Clinical Impairments Affecting Rehab Potential  has to walk a lot at work    PT Frequency  2x / week    PT Duration  4 weeks    PT Treatment/Interventions  Cryotherapy;Electrical Stimulation;Iontophoresis 4mg /ml Dexamethasone;Moist Heat;Ultrasound;Therapeutic exercise;Neuromuscular re-education;Therapeutic activities;Manual techniques;Passive range of motion;Dry needling;Taping;Joint Manipulations    PT Next Visit Plan  progress strength and transistion to discharge next week    PT Home Exercise Plan  HSS, quad stretch,Hip flexion stretch. Added lunges, step ups, bridge on ball, H.S curl on ball, ball wall squats.     Consulted and Agree with Plan of Care  Patient       Patient will benefit from skilled therapeutic intervention in order to improve the following deficits and impairments:  Abnormal gait, Decreased activity tolerance, Decreased range of motion, Decreased strength, Decreased endurance, Difficulty walking, Impaired flexibility, Pain  Visit Diagnosis: Acute pain of left knee  Stiffness of left knee, not elsewhere classified  Difficulty in walking, not elsewhere classified     Problem List There are no active problems to display for this patient.   [name] 02/05/2019, 7:18 AM  [facility] [address] [place], [place], [address] Phone: [phone]   Fax:  [phone]  Name: Heather Ray MRN: [id number] Date of Birth: February 25, 1971

## 2019-02-09 ENCOUNTER — Ambulatory Visit: Payer: 59 | Attending: Orthopaedic Surgery | Admitting: Physical Therapy

## 2019-02-09 DIAGNOSIS — M25562 Pain in left knee: Secondary | ICD-10-CM

## 2019-02-09 DIAGNOSIS — M25662 Stiffness of left knee, not elsewhere classified: Secondary | ICD-10-CM

## 2019-02-09 DIAGNOSIS — R262 Difficulty in walking, not elsewhere classified: Secondary | ICD-10-CM | POA: Diagnosis not present

## 2019-02-09 NOTE — Therapy (Signed)
[place], [place], [address] Phone: [phone]   Fax:  [phone]  Physical Therapy Treatment  Patient Details  Name: Heather Ray MRN: [id number] Date of Birth: [birth year] Referring Provider (PT): Ophelia Charter, MD   Encounter Date: 02/09/2019  PT End of Session - 02/09/19 2158    Visit Number  7    Number of Visits  8    Date for PT Re-Evaluation  02/11/19    Authorization Type  [facility]    PT Start Time  4742    PT Stop Time  1800    PT Time Calculation (min)  45 min    Activity Tolerance  Patient tolerated treatment well    Behavior During Therapy  [facility] for tasks assessed/performed       Past Medical History:  Diagnosis Date  . Fibroids     Past Surgical History:  Procedure Laterality Date  . CESAREAN SECTION    . KNEE ARTHROPLASTY    . KNEE ARTHROSCOPY WITH MEDIAL MENISECTOMY Left 01/02/2019   Procedure: LEFT KNEE ARTHROSCOPY WITH PARTIAL MEDIAL MENISECTOMY AND PATELLAR CHONDROPLASTY;  Surgeon: Hiram Gash, MD;  Location: [place];  Service: Orthopedics;  Laterality: Left;    There were no vitals filed for this visit.  Subjective Assessment - 02/09/19 2154    Subjective  My knee is doing great just some mild soreness    Currently in Pain?  No/denies                       [facility] Adult PT Treatment/Exercise - 02/09/19 0001      Knee/Hip Exercises: Stretches   Active Hamstring Stretch  Left;30 seconds;2 reps    Quad Stretch  Left;2 reps;30 seconds    Quad Stretch Limitations  prone     ITB Stretch  Left;30 seconds;3 reps      Knee/Hip Exercises: Aerobic   Elliptical  3 min fwd, 3 min back, L6      Knee/Hip Exercises: Machines for Strengthening   Cybex Knee Extension  10 lbs x20 bilat then 5 lbs Lt leg all 20 reps    Cybex Knee Flexion  35 lbs X 20    Cybex Leg Press  55 lbs X 20 bilat then 35 lbs X 20 Lt      Knee/Hip Exercises: Plyometrics   Other Plyometric Exercises  agillity quick feet fwd/back, lateral, 4 square      Knee/Hip Exercises: Standing   Other Standing Knee Exercises  bosu squats  X15, sidestepping with squat blue band       Knee/Hip Exercises: Supine   Bridges  15 reps    Bridges Limitations  with ball sq and alt knee ext                  PT Long Term Goals - 01/23/19 0900      PT LONG TERM GOAL #1   Title  Pt will be I and compliant with HEP. 4 weeks 02/11/19    Status  On-going      PT LONG TERM GOAL #2   Title  Pt will increase Lt knee AROM to 0-125 deg to improve function. 4 weeks 02/11/19    Status  On-going      PT LONG TERM GOAL #3   Title  Pt will improve Lt LE strength to 5/5 MMT. 4 weeks 02/11/19    Status  New  PT LONG TERM GOAL #4   Title  Pt will improve FOTO to less than 41% limited. 4 weeks 02/11/19    Status  New      PT LONG TERM GOAL #5   Title  Pt will show WNL gait pattern for community ambulation and on uneven surfaces and up/down stairs reciprocally all with less than 2/10 knee pain. 4 weeks 02/11/19    Status  New            Plan - 02/09/19 2158    Clinical Impression Statement  Pt has made excellent progress, no issues today with knee and able to progress to agility activities without complaints. Plan to DC next visit    Rehab Potential  Excellent    Clinical Impairments Affecting Rehab Potential  has to walk a lot at work    PT Frequency  2x / week    PT Duration  4 weeks    PT Treatment/Interventions  Cryotherapy;Electrical Stimulation;Iontophoresis 4mg /ml Dexamethasone;Moist Heat;Ultrasound;Therapeutic exercise;Neuromuscular re-education;Therapeutic activities;Manual techniques;Passive range of motion;Dry needling;Taping;Joint Manipulations    PT Next Visit Plan  discharge    PT Home Exercise Plan  HSS, quad stretch,Hip flexion stretch. Added lunges, step ups, bridge on ball, H.S curl on ball, ball wall squats.     Consulted and Agree with Plan of Care   Patient       Patient will benefit from skilled therapeutic intervention in order to improve the following deficits and impairments:  Abnormal gait, Decreased activity tolerance, Decreased range of motion, Decreased strength, Decreased endurance, Difficulty walking, Impaired flexibility, Pain  Visit Diagnosis: Acute pain of left knee  Stiffness of left knee, not elsewhere classified  Difficulty in walking, not elsewhere classified     Problem List There are no active problems to display for this patient.   [name] 02/09/2019, 10:00 PM  [place], [place], [address] Phone: [phone]   Fax:  [phone]  Name: Heather Ray MRN: [id number] Date of Birth: [birth year]

## 2019-02-11 ENCOUNTER — Ambulatory Visit: Payer: 59 | Admitting: Physical Therapy

## 2019-02-11 DIAGNOSIS — M25562 Pain in left knee: Secondary | ICD-10-CM

## 2019-02-11 DIAGNOSIS — R262 Difficulty in walking, not elsewhere classified: Secondary | ICD-10-CM

## 2019-02-11 DIAGNOSIS — M25662 Stiffness of left knee, not elsewhere classified: Secondary | ICD-10-CM

## 2019-02-11 NOTE — Therapy (Signed)
[place], [place], [address] Phone: [phone]   Fax:  [phone]  Physical Therapy Treatment  Patient Details  Name: Heather Ray MRN: [id number] Date of Birth: [birth year] Referring Provider (PT): Ophelia Charter, MD   Encounter Date: 02/11/2019  PT End of Session - 02/11/19 1739    Visit Number  8    Number of Visits  8    Date for PT Re-Evaluation  02/11/19    Authorization Type  [facility]    PT Start Time  1630    PT Stop Time  1710    PT Time Calculation (min)  40 min    Activity Tolerance  Patient tolerated treatment well    Behavior During Therapy  [facility] for tasks assessed/performed       Past Medical History:  Diagnosis Date  . Fibroids     Past Surgical History:  Procedure Laterality Date  . CESAREAN SECTION    . KNEE ARTHROPLASTY    . KNEE ARTHROSCOPY WITH MEDIAL MENISECTOMY Left 01/02/2019   Procedure: LEFT KNEE ARTHROSCOPY WITH PARTIAL MEDIAL MENISECTOMY AND PATELLAR CHONDROPLASTY;  Surgeon: Hiram Gash, MD;  Location: [place];  Service: Orthopedics;  Laterality: Left;    There were no vitals filed for this visit.  Subjective Assessment - 02/11/19 1739    Subjective  Pt relays she feels ready for discharge and PT agrees    Currently in Pain?  No/denies         [facility] PT Assessment - 02/11/19 0001      Assessment   Medical Diagnosis  L Knee Partial Medical Meniscectomy & patellar chondroplasty    Referring Provider (PT)  Ophelia Charter, MD      AROM   Left Knee Extension  -2    Left Knee Flexion  136      Strength   Overall Strength Comments  Knee and hip strength 5/5 MMT bilat      Therex performed today:  Elliptical 6 min warm up Level 5 H.S. stretch, quad stretch, IT band stretch 30 sec X 3 Bridge on [place] with H.S. curl  x20 Hop side to side X 10 unilat hop X 5 agility ladder mix 5 min  Deadlift 10 lb kettlebell from floor X 20    PT Education  - 02/11/19 1739    Education Details  HEP review    Person(s) Educated  Patient    Methods  Explanation    Comprehension  Verbalized understanding          PT Long Term Goals - 02/11/19 1649      PT LONG TERM GOAL #1   Title  Pt will be I and compliant with HEP. 4 weeks 02/11/19    Status  Achieved      PT LONG TERM GOAL #2   Title  Pt will increase Lt knee AROM to 0-125 deg to improve function. 4 weeks 02/11/19    Baseline  now 2-133    Status  Partially Met      PT LONG TERM GOAL #3   Title  Pt will improve Lt LE strength to 5/5 MMT. 4 weeks 02/11/19    Status  Achieved      PT LONG TERM GOAL #4   Title  Pt will improve FOTO to less than 41% limited. 4 weeks 02/11/19    Status  Achieved      PT LONG TERM GOAL #5   Title  Pt will show WNL gait pattern for community ambulation and on uneven surfaces and up/down stairs reciprocally all with less than 2/10 knee pain. 4 weeks 02/11/19    Status  Achieved            Plan - 02/11/19 1741    Clinical Impression Statement  Pt has met all goals and has no pain. She was able to squat, hop, and perform agility drills today without complaints. She now has WNL ROM and strength except missing 2 deg of knee extension. She will be discharged to HEP and she feels confident she can continue to progress at home and has no questions or concerns regarding discharge.     Consulted and Agree with Plan of Care  Patient       Patient will benefit from skilled therapeutic intervention in order to improve the following deficits and impairments:     Visit Diagnosis: Acute pain of left knee  Stiffness of left knee, not elsewhere classified  Difficulty in walking, not elsewhere classified     Problem List There are no active problems to display for this patient. PHYSICAL THERAPY DISCHARGE SUMMARY  Visits from Start of Care: 8  Current functional level related to goals / functional outcomes: See above   Remaining deficits: none    Education / Equipment: HEP Plan: Patient agrees to discharge.  Patient goals were met. Patient is being discharged due to meeting the stated rehab goals.  ?????       Debbe Odea 02/11/2019, 5:44 PM  [facility] [address] [place], [place], [address] Phone: [phone]   Fax:  [phone]  Name: Heather Ray MRN: [id number] Date of Birth: [birth year]

## 2019-02-12 DIAGNOSIS — S83242D Other tear of medial meniscus, current injury, left knee, subsequent encounter: Secondary | ICD-10-CM | POA: Diagnosis not present

## 2019-03-11 MED FILL — NORETHIND-ETH ESTRAD 1-0.02: 1-20 | 84 days supply | Qty: 84 | Fill #0

## 2019-05-18 ENCOUNTER — Other Ambulatory Visit: Payer: Self-pay | Admitting: Obstetrics and Gynecology

## 2019-05-18 DIAGNOSIS — Z1231 Encounter for screening mammogram for malignant neoplasm of breast: Secondary | ICD-10-CM

## 2019-06-08 MED FILL — LARIN FE 1.5-30 TABLET: 1.5-30 | 63 days supply | Qty: 84 | Fill #0

## 2019-06-26 DIAGNOSIS — D251 Intramural leiomyoma of uterus: Secondary | ICD-10-CM | POA: Diagnosis not present

## 2019-06-26 DIAGNOSIS — N951 Menopausal and female climacteric states: Secondary | ICD-10-CM | POA: Diagnosis not present

## 2019-06-26 DIAGNOSIS — Z7689 Persons encountering health services in other specified circumstances: Secondary | ICD-10-CM | POA: Diagnosis not present

## 2019-06-26 DIAGNOSIS — R140 Abdominal distension (gaseous): Secondary | ICD-10-CM | POA: Diagnosis not present

## 2019-06-26 DIAGNOSIS — Z1151 Encounter for screening for human papillomavirus (HPV): Secondary | ICD-10-CM | POA: Diagnosis not present

## 2019-06-26 DIAGNOSIS — Z803 Family history of malignant neoplasm of breast: Secondary | ICD-10-CM | POA: Diagnosis not present

## 2019-06-26 DIAGNOSIS — Z01419 Encounter for gynecological examination (general) (routine) without abnormal findings: Secondary | ICD-10-CM | POA: Diagnosis not present

## 2019-07-02 ENCOUNTER — Other Ambulatory Visit: Payer: Self-pay

## 2019-07-02 ENCOUNTER — Ambulatory Visit
Admission: RE | Admit: 2019-07-02 | Discharge: 2019-07-02 | Disposition: A | Discharge status: Home / Self Care | Payer: 59 | Source: Ambulatory Visit | Attending: Obstetrics and Gynecology | Admitting: Obstetrics and Gynecology

## 2019-07-02 DIAGNOSIS — Z1231 Encounter for screening mammogram for malignant neoplasm of breast: Secondary | ICD-10-CM

## 2019-07-24 MED FILL — LARIN 21 1-20 TABLET: 1-20 | 84 days supply | Qty: 84 | Fill #0

## 2019-08-11 MED FILL — LARIN FE 1.5-30 TABLET: 1.5-30 | 63 days supply | Qty: 84 | Fill #0

## 2019-08-14 DIAGNOSIS — S83242D Other tear of medial meniscus, current injury, left knee, subsequent encounter: Secondary | ICD-10-CM | POA: Diagnosis not present

## 2019-08-14 DIAGNOSIS — M25561 Pain in right knee: Secondary | ICD-10-CM | POA: Diagnosis not present

## 2019-08-21 DIAGNOSIS — M25561 Pain in right knee: Secondary | ICD-10-CM | POA: Diagnosis not present

## 2019-09-03 DIAGNOSIS — N938 Other specified abnormal uterine and vaginal bleeding: Secondary | ICD-10-CM | POA: Diagnosis not present

## 2019-09-03 DIAGNOSIS — Z3041 Encounter for surveillance of contraceptive pills: Secondary | ICD-10-CM | POA: Diagnosis not present

## 2019-09-03 DIAGNOSIS — R87810 Cervical high risk human papillomavirus (HPV) DNA test positive: Secondary | ICD-10-CM | POA: Diagnosis not present

## 2019-09-03 DIAGNOSIS — Z3202 Encounter for pregnancy test, result negative: Secondary | ICD-10-CM | POA: Diagnosis not present

## 2019-09-04 DIAGNOSIS — Z0000 Encounter for general adult medical examination without abnormal findings: Secondary | ICD-10-CM | POA: Diagnosis not present

## 2019-09-04 DIAGNOSIS — Z01419 Encounter for gynecological examination (general) (routine) without abnormal findings: Secondary | ICD-10-CM | POA: Diagnosis not present

## 2019-09-15 DIAGNOSIS — M25561 Pain in right knee: Secondary | ICD-10-CM | POA: Diagnosis not present

## 2019-10-06 DIAGNOSIS — M25561 Pain in right knee: Secondary | ICD-10-CM | POA: Diagnosis not present

## 2019-10-12 MED FILL — LARIN FE 1.5-30 TABLET: 1.5-30 | 63 days supply | Qty: 84 | Fill #1

## 2019-12-17 ENCOUNTER — Other Ambulatory Visit: Payer: Self-pay

## 2019-12-17 ENCOUNTER — Encounter (HOSPITAL_BASED_OUTPATIENT_CLINIC_OR_DEPARTMENT_OTHER): Payer: Self-pay | Admitting: Orthopaedic Surgery

## 2019-12-18 NOTE — Progress Notes (Signed)

## 2019-12-21 ENCOUNTER — Other Ambulatory Visit (HOSPITAL_COMMUNITY)
Admission: RE | Admit: 2019-12-21 | Discharge: 2019-12-21 | Disposition: A | Discharge status: Home / Self Care | Payer: 59 | Source: Ambulatory Visit | Attending: Orthopaedic Surgery | Admitting: Orthopaedic Surgery

## 2019-12-21 DIAGNOSIS — Z01812 Encounter for preprocedural laboratory examination: Secondary | ICD-10-CM | POA: Insufficient documentation

## 2019-12-21 DIAGNOSIS — Z20822 Contact with and (suspected) exposure to covid-19: Secondary | ICD-10-CM | POA: Diagnosis not present

## 2019-12-21 MED FILL — LARIN FE 1.5-30 TABLET: 1.5-30 | 63 days supply | Qty: 84 | Fill #2

## 2019-12-21 NOTE — H&P (Signed)
PREOPERATIVE H&P  Chief Complaint: RIGHT KNEE MEDIAL MENISCUS TEAR, [id number]  HPI: Heather Ray is a 49 y.o. female who is scheduled for RIGHT KNEE ARTHROSCOPY WITH MEDIAL MENISCECTOMY.   Heather Ray has had pain in her right knee for some time that we have been managing non-operatively. Clinically, her history and exam was consistent with a meniscus tear as she had mechanical symptoms and catching, locking and giving way of the knee. She recently had a slip and fall, twisting injury, in her garage when her floor was wet. This caused continued pain on both the medial and lateral aspect of her knee.  She had some additional swelling as well. She has previously had a left knee scope with medial meniscectomy in January 2020. She did well after this surgery.   Her symptoms are rated as moderate to severe, and have been worsening.  This is significantly impairing activities of daily living.    Please see clinic note for further details on this patient's care.    She has elected for surgical management.   Past Medical History:  Diagnosis Date  . Fibroids    Past Surgical History:  Procedure Laterality Date  . CESAREAN SECTION    . KNEE ARTHROPLASTY    . KNEE ARTHROSCOPY WITH MEDIAL MENISECTOMY Left 01/02/2019   Procedure: LEFT KNEE ARTHROSCOPY WITH PARTIAL MEDIAL MENISECTOMY AND PATELLAR CHONDROPLASTY;  Surgeon: Hiram Gash, MD;  Location: [place];  Service: Orthopedics;  Laterality: Left;   Social History   Socioeconomic History  . Marital status: Married    Spouse name: Not on file  . Number of children: Not on file  . Years of education: Not on file  . Highest education level: Not on file  Occupational History  . Not on file  Tobacco Use  . Smoking status: Never Smoker  . Smokeless tobacco: Never Used  Substance and Sexual Activity  . Alcohol use: Never  . Drug use: Never  . Sexual activity: Not on file  Other Topics Concern  . Not on file  Social  History Narrative  . Not on file   Social Determinants of Health   Financial Resource Strain:   . Difficulty of Paying Living Expenses: Not on file  Food Insecurity:   . Worried About Charity fundraiser in the Last Year: Not on file  . Ran Out of Food in the Last Year: Not on file  Transportation Needs:   . Lack of Transportation (Medical): Not on file  . Lack of Transportation (Non-Medical): Not on file  Physical Activity:   . Days of Exercise per Week: Not on file  . Minutes of Exercise per Session: Not on file  Stress:   . Feeling of Stress : Not on file  Social Connections:   . Frequency of Communication with Friends and Family: Not on file  . Frequency of Social Gatherings with Friends and Family: Not on file  . Attends Religious Services: Not on file  . Active Member of Clubs or Organizations: Not on file  . Attends Archivist Meetings: Not on file  . Marital Status: Not on file   Family History  Problem Relation Age of Onset  . Breast cancer Mother 96   No Known Allergies Prior to Admission medications   Medication Sig Start Date End Date Taking? Authorizing Provider  norethindrone-ethinyl estradiol Ronnette Juniper 1/20) 1-20 MG-MCG tablet Take 1 tablet by mouth daily.   Yes [provider]  ROS: All other systems have been reviewed and were otherwise negative with the exception of those mentioned in the HPI and as above.  Physical Exam: General: Alert, no acute distress Cardiovascular: No pedal edema Respiratory: No cyanosis, no use of accessory musculature GI: No organomegaly, abdomen is soft and non-tender Skin: No lesions in the area of chief complaint Neurologic: Sensation intact distally Psychiatric: Patient is competent for consent with normal mood and affect Lymphatic: No axillary or cervical lymphadenopathy  MUSCULOSKELETAL:  Right knee: Tender to palpation posteromedially, as well as posterolaterally.  No effusion today.  Range of motion  is intact.  Quads are 4/5.  Positive McMurray.  Positive patellar grind.    Imaging: MRI of the right knee was benign. patellofemoral arthritis was noted.   Assessment: Right knee pain, concerning for meniscal pathology versus mechanical symptoms from loose chondral flaps.    Plan: Plan for Procedure(s): RIGHT KNEE ARTHROSCOPY WITH Possible MEDIAL MENISCECTOMY  The risks benefits and alternatives were discussed with the patient including but not limited to the risks of nonoperative treatment, versus surgical intervention including infection, bleeding, nerve injury,  blood clots, cardiopulmonary complications, morbidity, mortality, among others, and they were willing to proceed.   The patient acknowledged the explanation, agreed to proceed with the plan and consent was signed.   Operative Plan: Right knee scope with possible medial meniscectomy  Discharge Medications: Standard DVT Prophylaxis: Aspirin Physical Therapy: +/- outpatient PT Special Discharge needs: Knee immobilizer   Ethelda Chick, PA-C  12/21/2019 4:29 PM

## 2019-12-22 LAB — NOVEL CORONAVIRUS, NAA (HOSP ORDER, SEND-OUT TO REF LAB; TAT 18-24 HRS): SARS-CoV-2, NAA: NOT DETECTED

## 2019-12-24 ENCOUNTER — Encounter (HOSPITAL_BASED_OUTPATIENT_CLINIC_OR_DEPARTMENT_OTHER): Payer: Self-pay | Admitting: Orthopaedic Surgery

## 2019-12-24 ENCOUNTER — Ambulatory Visit (HOSPITAL_BASED_OUTPATIENT_CLINIC_OR_DEPARTMENT_OTHER): Payer: 59 | Admitting: Anesthesiology

## 2019-12-24 ENCOUNTER — Encounter (HOSPITAL_BASED_OUTPATIENT_CLINIC_OR_DEPARTMENT_OTHER)
Admission: RE | Disposition: A | Discharge status: Home / Self Care | Payer: Self-pay | Source: Home / Self Care | Attending: Orthopaedic Surgery

## 2019-12-24 ENCOUNTER — Other Ambulatory Visit: Payer: Self-pay

## 2019-12-24 ENCOUNTER — Ambulatory Visit (HOSPITAL_BASED_OUTPATIENT_CLINIC_OR_DEPARTMENT_OTHER)
Admission: RE | Admit: 2019-12-24 | Discharge: 2019-12-24 | Disposition: A | Discharge status: Home / Self Care | Payer: 59 | Attending: Orthopaedic Surgery | Admitting: Orthopaedic Surgery

## 2019-12-24 DIAGNOSIS — S83241A Other tear of medial meniscus, current injury, right knee, initial encounter: Secondary | ICD-10-CM | POA: Diagnosis not present

## 2019-12-24 DIAGNOSIS — W010XXA Fall on same level from slipping, tripping and stumbling without subsequent striking against object, initial encounter: Secondary | ICD-10-CM | POA: Insufficient documentation

## 2019-12-24 HISTORY — PX: KNEE ARTHROSCOPY WITH MEDIAL MENISECTOMY: SHX5651

## 2019-12-24 LAB — POCT PREGNANCY, URINE: Preg Test, Ur: NEGATIVE

## 2019-12-24 SURGERY — ARTHROSCOPY, KNEE, WITH MEDIAL MENISCECTOMY
Anesthesia: General | Site: Knee | Laterality: Right

## 2019-12-24 MED ORDER — OXYCODONE HCL 5 MG/5ML PO SOLN: 5.0000 mg | Freq: Once | ORAL | Status: AC | PRN

## 2019-12-24 MED ORDER — BUPIVACAINE HCL (PF) 0.25 % IJ SOLN
INTRAMUSCULAR | Status: DC | PRN
  Administered 2019-12-24: 20 mL

## 2019-12-24 MED ORDER — DEXAMETHASONE SODIUM PHOSPHATE 10 MG/ML IJ SOLN
INTRAMUSCULAR | Status: AC
  Filled 2019-12-24: qty 1

## 2019-12-24 MED ORDER — LIDOCAINE 2% (20 MG/ML) 5 ML SYRINGE
INTRAMUSCULAR | Status: AC
  Filled 2019-12-24: qty 5

## 2019-12-24 MED ORDER — KETOROLAC TROMETHAMINE 30 MG/ML IJ SOLN
INTRAMUSCULAR | Status: DC | PRN
  Administered 2019-12-24: 30 mg via INTRAVENOUS

## 2019-12-24 MED ORDER — ONDANSETRON HCL 4 MG/2ML IJ SOLN: 4.0000 mg | Freq: Once | INTRAMUSCULAR | Status: DC | PRN

## 2019-12-24 MED ORDER — FENTANYL CITRATE (PF) 100 MCG/2ML IJ SOLN
25.0000 ug | INTRAMUSCULAR | Status: DC | PRN
  Administered 2019-12-24 (×3): 50 ug via INTRAVENOUS

## 2019-12-24 MED ORDER — CEFAZOLIN SODIUM-DEXTROSE 2-4 GM/100ML-% IV SOLN
2.0000 g | INTRAVENOUS | Status: AC
  Administered 2019-12-24: 11:00:00 2 g via INTRAVENOUS

## 2019-12-24 MED ORDER — ONDANSETRON HCL 4 MG/2ML IJ SOLN
INTRAMUSCULAR | Status: AC
  Filled 2019-12-24: qty 2

## 2019-12-24 MED ORDER — MIDAZOLAM HCL 2 MG/2ML IJ SOLN
INTRAMUSCULAR | Status: AC
  Filled 2019-12-24: qty 2

## 2019-12-24 MED ORDER — ACETAMINOPHEN 160 MG/5ML PO SOLN: 325.0000 mg | ORAL | Status: DC | PRN

## 2019-12-24 MED ORDER — DEXAMETHASONE SODIUM PHOSPHATE 4 MG/ML IJ SOLN
INTRAMUSCULAR | Status: DC | PRN
  Administered 2019-12-24: 5 mg via INTRAVENOUS

## 2019-12-24 MED ORDER — CEFAZOLIN SODIUM-DEXTROSE 2-4 GM/100ML-% IV SOLN
INTRAVENOUS | Status: AC
  Filled 2019-12-24: qty 100

## 2019-12-24 MED ORDER — FENTANYL CITRATE (PF) 100 MCG/2ML IJ SOLN
INTRAMUSCULAR | Status: AC
  Filled 2019-12-24: qty 2

## 2019-12-24 MED ORDER — LACTATED RINGERS IV SOLN: INTRAVENOUS | Status: DC

## 2019-12-24 MED ORDER — OXYCODONE HCL 5 MG PO TABS: ORAL_TABLET | ORAL | 0 refills | Status: AC

## 2019-12-24 MED ORDER — ACETAMINOPHEN 500 MG PO TABS: 1000.0000 mg | ORAL_TABLET | Freq: Three times a day (TID) | ORAL | 0 refills | Status: AC

## 2019-12-24 MED ORDER — PROPOFOL 10 MG/ML IV BOLUS
INTRAVENOUS | Status: DC | PRN
  Administered 2019-12-24: 200 mg via INTRAVENOUS

## 2019-12-24 MED ORDER — OXYCODONE HCL 5 MG PO TABS
ORAL_TABLET | ORAL | Status: AC
  Filled 2019-12-24: qty 1

## 2019-12-24 MED ORDER — LIDOCAINE HCL (CARDIAC) PF 100 MG/5ML IV SOSY
PREFILLED_SYRINGE | INTRAVENOUS | Status: DC | PRN
  Administered 2019-12-24: 60 mg via INTRAVENOUS

## 2019-12-24 MED ORDER — OXYCODONE HCL 5 MG PO TABS
5.0000 mg | ORAL_TABLET | Freq: Once | ORAL | Status: AC | PRN
  Administered 2019-12-24: 13:00:00 5 mg via ORAL

## 2019-12-24 MED ORDER — MIDAZOLAM HCL 2 MG/2ML IJ SOLN
1.0000 mg | INTRAMUSCULAR | Status: DC | PRN
  Administered 2019-12-24: 2 mg via INTRAVENOUS

## 2019-12-24 MED ORDER — ASPIRIN 81 MG PO CHEW: 81.0000 mg | CHEWABLE_TABLET | Freq: Two times a day (BID) | ORAL | 1 refills | Status: AC

## 2019-12-24 MED ORDER — MEPERIDINE HCL 25 MG/ML IJ SOLN: 6.2500 mg | INTRAMUSCULAR | Status: DC | PRN

## 2019-12-24 MED ORDER — ACETAMINOPHEN 325 MG PO TABS: 325.0000 mg | ORAL_TABLET | ORAL | Status: DC | PRN

## 2019-12-24 MED ORDER — EPINEPHRINE PF 1 MG/ML IJ SOLN
INTRAMUSCULAR | Status: AC
  Filled 2019-12-24: qty 2

## 2019-12-24 MED ORDER — FENTANYL CITRATE (PF) 100 MCG/2ML IJ SOLN
50.0000 ug | INTRAMUSCULAR | Status: AC | PRN
  Administered 2019-12-24: 11:00:00 25 ug via INTRAVENOUS
  Administered 2019-12-24: 50 ug via INTRAVENOUS
  Administered 2019-12-24: 25 ug via INTRAVENOUS

## 2019-12-24 MED ORDER — ONDANSETRON HCL 4 MG/2ML IJ SOLN
INTRAMUSCULAR | Status: DC | PRN
  Administered 2019-12-24: 4 mg via INTRAVENOUS

## 2019-12-24 MED ORDER — CHLORHEXIDINE GLUCONATE 4 % EX LIQD: 60.0000 mL | Freq: Once | CUTANEOUS | Status: DC

## 2019-12-24 MED ORDER — MELOXICAM 7.5 MG PO TABS: 7.5000 mg | ORAL_TABLET | Freq: Every day | ORAL | 2 refills | Status: AC

## 2019-12-24 MED ORDER — ONDANSETRON HCL 4 MG PO TABS: 4.0000 mg | ORAL_TABLET | Freq: Three times a day (TID) | ORAL | 1 refills | Status: AC | PRN

## 2019-12-24 MED ORDER — SODIUM CHLORIDE 0.9 % IR SOLN
Status: DC | PRN
  Administered 2019-12-24: 3000 mL

## 2019-12-24 MED ORDER — PROPOFOL 10 MG/ML IV BOLUS
INTRAVENOUS | Status: AC
  Filled 2019-12-24: qty 20

## 2019-12-24 MED FILL — MELOXICAM 7.5 MG TABLET: 7.5 | 30 days supply | Qty: 30 | Fill #0

## 2019-12-24 MED FILL — ONDANSETRON HCL 4 MG TABLET: 4 | 4 days supply | Qty: 10 | Fill #0

## 2019-12-24 MED FILL — oxyCODONE HCL 5 MG TABS: 5 | 5 days supply | Qty: 30 | Fill #0

## 2019-12-24 SURGICAL SUPPLY — 44 items
BANDAGE ESMARK 6X9 LF (GAUZE/BANDAGES/DRESSINGS) IMPLANT
BENZOIN TINCTURE PRP APPL 2/3 (GAUZE/BANDAGES/DRESSINGS) IMPLANT
BLADE CLIPPER SURG (BLADE) IMPLANT
BNDG ELASTIC 6X5.8 VLCR STR LF (GAUZE/BANDAGES/DRESSINGS) ×3 IMPLANT
BNDG ESMARK 6X9 LF (GAUZE/BANDAGES/DRESSINGS)
CHLORAPREP W/TINT 26 (MISCELLANEOUS) ×3 IMPLANT
CLOSURE STERI-STRIP 1/2X4 (GAUZE/BANDAGES/DRESSINGS) ×1
CLSR STERI-STRIP ANTIMIC 1/2X4 (GAUZE/BANDAGES/DRESSINGS) ×2 IMPLANT
CUFF TOURN SGL QUICK 34 (TOURNIQUET CUFF) ×2
CUFF TRNQT CYL 34X4.125X (TOURNIQUET CUFF) ×1 IMPLANT
DISSECTOR 3.5MM X 13CM CVD (MISCELLANEOUS) IMPLANT
DISSECTOR 4.0MMX13CM CVD (MISCELLANEOUS) IMPLANT
DRAPE ARTHROSCOPY W/POUCH 90 (DRAPES) ×3 IMPLANT
DRAPE IMP U-DRAPE 54X76 (DRAPES) ×3 IMPLANT
DRAPE U-SHAPE 47X51 STRL (DRAPES) ×3 IMPLANT
GAUZE SPONGE 4X4 12PLY STRL (GAUZE/BANDAGES/DRESSINGS) ×3 IMPLANT
GLOVE BIO SURGEON STRL SZ 6.5 (GLOVE) ×2 IMPLANT
GLOVE BIO SURGEONS STRL SZ 6.5 (GLOVE) ×1
GLOVE BIOGEL PI IND STRL 6.5 (GLOVE) ×1 IMPLANT
GLOVE BIOGEL PI IND STRL 7.5 (GLOVE) ×2 IMPLANT
GLOVE BIOGEL PI IND STRL 8 (GLOVE) ×1 IMPLANT
GLOVE BIOGEL PI INDICATOR 6.5 (GLOVE) ×2
GLOVE BIOGEL PI INDICATOR 7.5 (GLOVE) ×4
GLOVE BIOGEL PI INDICATOR 8 (GLOVE) ×2
GLOVE ECLIPSE 8.0 STRL XLNG CF (GLOVE) ×6 IMPLANT
GLOVE SURG SS PI 7.5 STRL IVOR (GLOVE) ×3 IMPLANT
GOWN STRL REUS W/ TWL LRG LVL3 (GOWN DISPOSABLE) ×1 IMPLANT
GOWN STRL REUS W/TWL LRG LVL3 (GOWN DISPOSABLE) ×2
GOWN STRL REUS W/TWL XL LVL3 (GOWN DISPOSABLE) ×3 IMPLANT
KIT TURNOVER KIT B (KITS) ×3 IMPLANT
MANIFOLD NEPTUNE II (INSTRUMENTS) ×3 IMPLANT
NDL SAFETY ECLIPSE 18X1.5 (NEEDLE) ×1 IMPLANT
NEEDLE HYPO 18GX1.5 SHARP (NEEDLE) ×2
NS IRRIG 1000ML POUR BTL (IV SOLUTION) IMPLANT
PACK ARTHROSCOPY DSU (CUSTOM PROCEDURE TRAY) ×3 IMPLANT
PORT APPOLLO RF 90DEGREE MULTI (SURGICAL WAND) IMPLANT
SLEEVE SCD COMPRESS KNEE MED (MISCELLANEOUS) ×3 IMPLANT
SUT MNCRL AB 4-0 PS2 18 (SUTURE) ×3 IMPLANT
SYR 5ML LUER SLIP (SYRINGE) ×3 IMPLANT
TOWEL GREEN STERILE FF (TOWEL DISPOSABLE) ×3 IMPLANT
TUBE CONNECTING 20'X1/4 (TUBING) ×1
TUBE CONNECTING 20X1/4 (TUBING) ×2 IMPLANT
TUBING ARTHROSCOPY IRRIG 16FT (MISCELLANEOUS) ×3 IMPLANT
WATER STERILE IRR 1000ML POUR (IV SOLUTION) ×3 IMPLANT

## 2019-12-24 NOTE — Discharge Instructions (Signed)
  Post Anesthesia Home Care Instructions  Activity: Get plenty of rest for the remainder of the day. A responsible individual must stay with you for 24 hours following the procedure.  For the next 24 hours, DO NOT: -Drive a car -Paediatric nurse -Drink alcoholic beverages -Take any medication unless instructed by your physician -Make any legal decisions or sign important papers.  Meals: Start with liquid foods such as gelatin or soup. Progress to regular foods as tolerated. Avoid greasy, spicy, heavy foods. If nausea and/or vomiting occur, drink only clear liquids until the nausea and/or vomiting subsides. Call your physician if vomiting continues.  Special Instructions/Symptoms: Your throat may feel dry or sore from the anesthesia or the breathing tube placed in your throat during surgery. If this causes discomfort, gargle with warm salt water. The discomfort should disappear within 24 hours.  If you had a scopolamine patch placed behind your ear for the management of post- operative nausea and/or vomiting:  1. The medication in the patch is effective for 72 hours, after which it should be removed.  Wrap patch in a tissue and discard in the trash. Wash hands thoroughly with soap and water. 2. You may remove the patch earlier than 72 hours if you experience unpleasant side effects which may include dry mouth, dizziness or visual disturbances. 3. Avoid touching the patch. Wash your hands with soap and water after contact with the patch.    No ibuprofen until after 7pm tonight.

## 2019-12-24 NOTE — Anesthesia Procedure Notes (Signed)
Procedure Name: LMA Insertion Date/Time: 12/24/2019 10:34 AM Performed by: Maryella Shivers, CRNA Pre-anesthesia Checklist: Patient identified, Emergency Drugs available, Suction available and Patient being monitored Patient Re-evaluated:Patient Re-evaluated prior to induction Oxygen Delivery Method: Circle system utilized Preoxygenation: Pre-oxygenation with 100% oxygen Induction Type: IV induction Ventilation: Mask ventilation without difficulty LMA: LMA inserted LMA Size: 4.0 Number of attempts: 1 Airway Equipment and Method: Bite block Placement Confirmation: positive ETCO2 Tube secured with: Tape Dental Injury: Teeth and Oropharynx as per pre-operative assessment

## 2019-12-24 NOTE — Op Note (Signed)
Orthopaedic Surgery Operative Note (CSN: [id number])  Heather Ray  Mar 08, 1971 Date of Surgery: 12/24/2019   Diagnoses:  Right medial knee pain  Procedure: Right partial medial meniscectomy   Operative Finding Exam under anesthesia: Range of motion full and symmetric to opposite knee, ligamentously stable exam with normal lachman Suprapatellar pouch: Normal Patellofemoral Compartment: Mild patellofemoral chondrosis with only grade 2 changes very minimally about a 1 x 1 cm area on the medial facet of patella. Medial Compartment: Grade 2 changes on the tibia grade 1 changes on the femur diffusely, there was a root tear relatively unstable and caused meniscal extrusion.  This was debrided back to a stable base resecting about 40% of the posterior aspect of the meniscus with some peripheral posterior fibers still likely intact carrying some load. Lateral Compartment: Normal Intercondylar Notch: Normal  Successful completion of the planned procedure.  Patient's root tear was not really noted on MRI, we were able to identify it Intra-Op.  We did call and talk to the patient's husband to confirm that she would not want a repair and he did confirm that she would not be amenable to the extended recovery nonweightbearing status associated with that.  Post-operative plan: The patient will be weightbearing as tolerated.  The patient will be discharged home.  DVT prophylaxis aspirin 81 mg twice a day.   Pain control with PRN pain medication preferring oral medicines.  Follow up plan will be scheduled in approximately 7 days for incision check and XR.  Post-Op Diagnosis: Same Surgeons:Primary: Hiram Gash, MD Assistants:Caroline McBane PA-C Location: [place] OR ROOM 5 Anesthesia: General with local anesthetic infiltrated at the end of the case Antibiotics: Ancef 2g preop Tourniquet time: * No tourniquets in log * Estimated Blood Loss: Minimal Complications: None Specimens: None Implants: * No  implants in log *  Indications for Surgery:   Heather Ray is a 49 y.o. female with continued medial knee pain with mechanical symptoms.  MRI did not demonstrate a discrete tear however the patient had a root tear on the contralateral side and felt that this knee felt very similar.  She did well with an arthroscopic debridement and meniscectomy.  Benefits and risks of operative and nonoperative management were discussed prior to surgery with patient/guardian(s) and informed consent form was completed.  Specific risks including infection, need for additional surgery, postoperative arthrosis, continued pain, need for further surgery.   Procedure:   The patient was identified properly. Informed consent was obtained and the surgical site was marked. The patient was taken up to suite where general anesthesia was induced. The patient was placed in the supine position with a post against the surgical leg and a nonsterile tourniquet applied. The surgical leg was then prepped and draped usual sterile fashion.  A standard surgical timeout was performed.  2 standard anterior portals were made and diagnostic arthroscopy performed. Please note the findings as noted above.  We performed diagnostic arthroscopy with the findings above.  We performed a gentle chondroplasty of the medial femoral condyle and medial tibial plateau.  We are able to probe and identified that there was significant instability of the posterior root of the meniscus.  After confirming with the husband that they would not be amenable to repair we used a series of arthroscopic baskets as well as shaver to perform a partial meniscectomy involving the posterior 30 to 40% of the meniscus retaining some peripheral fibers.  This was unstable and we felt that an appropriate resection and deformed.  All loose bodies were removed.  gentle chondroplasty of the patellofemoral joint was performed as well.  Incisions closed with absorbable suture. The patient  was awoken from general anesthesia and taken to the PACU in stable condition without complication.   Noemi Chapel, PA-C, present and scrubbed throughout the case, critical for completion in a timely fashion, and for retraction, instrumentation, closure.

## 2019-12-24 NOTE — Interval H&P Note (Signed)
History and Physical Interval Note:  12/24/2019 9:17 AM  Heather Ray  has presented today for surgery, with the diagnosis of RIGHT KNEE [place], S83.249A.  The various methods of treatment have been discussed with the patient and family. After consideration of risks, benefits and other options for treatment, the patient has consented to  Procedure(s): RIGHT KNEE ARTHROSCOPY WITH MEDIAL MENISECTOMY (Right) as a surgical intervention.  The patient's history has been reviewed, patient examined, no change in status, stable for surgery.  I have reviewed the patient's chart and labs.  Questions were answered to the patient's satisfaction.     Hiram Gash

## 2019-12-24 NOTE — Anesthesia Preprocedure Evaluation (Signed)
Anesthesia Evaluation  Patient identified by MRN, date of birth, ID band Patient awake    Reviewed: Allergy & Precautions, NPO status , Patient's Chart, lab work & pertinent test results  History of Anesthesia Complications Negative for: history of anesthetic complications  Airway Mallampati: I  TM Distance: >3 FB Neck ROM: Full    Dental  (+) Teeth Intact, Dental Advisory Given   Pulmonary neg pulmonary ROS,    Pulmonary exam normal breath sounds clear to auscultation       Cardiovascular Exercise Tolerance: Good negative cardio ROS Normal cardiovascular exam Rhythm:Regular Rate:Normal     Neuro/Psych negative neurological ROS     GI/Hepatic negative GI ROS, Neg liver ROS,   Endo/Other  negative endocrine ROS  Renal/GU negative Renal ROS     Musculoskeletal LEFT MEDIAL MENISCUS TEAR   Abdominal   Peds  Hematology negative hematology ROS (+)   Anesthesia Other Findings Day of surgery medications reviewed with the patient.  Reproductive/Obstetrics negative OB ROS                             Anesthesia Physical  Anesthesia Plan  ASA: I  Anesthesia Plan: General   Post-op Pain Management:    Induction: Intravenous  PONV Risk Score and Plan: 4 or greater and Midazolam, Dexamethasone, Ondansetron and Scopolamine patch - Pre-op  Airway Management Planned: LMA  Additional Equipment:   Intra-op Plan:   Post-operative Plan: Extubation in OR  Informed Consent: I have reviewed the patients History and Physical, chart, labs and discussed the procedure including the risks, benefits and alternatives for the proposed anesthesia with the patient or authorized representative who has indicated his/her understanding and acceptance.     Dental advisory given  Plan Discussed with: CRNA, Anesthesiologist and Surgeon  Anesthesia Plan Comments:         Anesthesia Quick  Evaluation

## 2019-12-24 NOTE — Transfer of Care (Signed)
Immediate Anesthesia Transfer of Care Note  Patient: Heather Ray  Procedure(s) Performed: RIGHT KNEE ARTHROSCOPY WITH MEDIAL MENISECTOMY (Right Knee)  Patient Location: PACU  Anesthesia Type:General  Level of Consciousness: sedated  Airway & Oxygen Therapy: Patient Spontanous Breathing and Patient connected to nasal cannula oxygen  Post-op Assessment: Report given to RN and Post -op Vital signs reviewed and stable  Post vital signs: Reviewed and stable  Last Vitals:  Vitals Value Taken Time  BP 107/68 12/24/19 1122  Temp    Pulse 73 12/24/19 1125  Resp 11 12/24/19 1125  SpO2 99 % 12/24/19 1125  Vitals shown include unvalidated device data.  Last Pain:  Vitals:   12/24/19 0845  TempSrc: Temporal  PainSc: 0-No pain      Patients Stated Pain Goal: 4 ([id number])  Complications: No apparent anesthesia complications

## 2019-12-24 NOTE — Anesthesia Postprocedure Evaluation (Signed)
Anesthesia Post Note  Patient: Heather Ray  Procedure(s) Performed: RIGHT KNEE ARTHROSCOPY WITH MEDIAL MENISECTOMY (Right Knee)     Patient location during evaluation: PACU Anesthesia Type: General Level of consciousness: awake and alert Pain management: pain level controlled Vital Signs Assessment: post-procedure vital signs reviewed and stable Respiratory status: spontaneous breathing, nonlabored ventilation and respiratory function stable Cardiovascular status: blood pressure returned to baseline and stable Postop Assessment: no apparent nausea or vomiting Anesthetic complications: no    Last Vitals:  Vitals:   12/24/19 1200 12/24/19 1215  BP: 128/74 133/75  Pulse: 68 68  Resp: 11 14  Temp:    SpO2: 100% 100%    Last Pain:  Vitals:   12/24/19 1215  TempSrc:   PainSc: [place]

## 2019-12-25 ENCOUNTER — Encounter: Payer: Self-pay | Admitting: *Deleted

## 2020-01-01 DIAGNOSIS — S83241D Other tear of medial meniscus, current injury, right knee, subsequent encounter: Secondary | ICD-10-CM | POA: Diagnosis not present

## 2020-01-07 ENCOUNTER — Encounter: Payer: Self-pay | Admitting: Physical Therapy

## 2020-01-07 ENCOUNTER — Other Ambulatory Visit: Payer: Self-pay

## 2020-01-07 ENCOUNTER — Ambulatory Visit: Payer: 59 | Attending: Orthopaedic Surgery | Admitting: Physical Therapy

## 2020-01-07 DIAGNOSIS — R600 Localized edema: Secondary | ICD-10-CM | POA: Diagnosis not present

## 2020-01-07 DIAGNOSIS — M25561 Pain in right knee: Secondary | ICD-10-CM | POA: Diagnosis not present

## 2020-01-07 DIAGNOSIS — M25661 Stiffness of right knee, not elsewhere classified: Secondary | ICD-10-CM | POA: Diagnosis not present

## 2020-01-07 NOTE — Therapy (Signed)
[facility] [place], [place], [address] Phone: [phone]   Fax:  [phone]  Physical Therapy Evaluation  Patient Details  Name: Heather Ray MRN: [id number] Date of Birth: 1971-10-15 Referring Provider (PT): Ophelia Charter, MD   Encounter Date: 01/07/2020  PT End of Session - 01/07/20 1814    Visit Number  1    Number of Visits  6    Date for PT Re-Evaluation  02/18/20    Authorization Type  [facility] [employer]    PT Start Time  1710    PT Stop Time  [id number]    PT Time Calculation (min)  64 min    Activity Tolerance  Patient tolerated treatment well    Behavior During Therapy  [facility] for tasks assessed/performed       Past Medical History:  Diagnosis Date  . Fibroids     Past Surgical History:  Procedure Laterality Date  . CESAREAN SECTION    . KNEE ARTHROPLASTY    . KNEE ARTHROSCOPY WITH MEDIAL MENISECTOMY Left 01/02/2019   Procedure: LEFT KNEE ARTHROSCOPY WITH PARTIAL MEDIAL MENISECTOMY AND PATELLAR CHONDROPLASTY;  Surgeon: Hiram Gash, MD;  Location: [place];  Service: Orthopedics;  Laterality: Left;  . KNEE ARTHROSCOPY WITH MEDIAL MENISECTOMY Right 12/24/2019   Procedure: RIGHT KNEE ARTHROSCOPY WITH MEDIAL MENISECTOMY;  Surgeon: Hiram Gash, MD;  Location: [place];  Service: Orthopedics;  Laterality: Right;    There were no vitals filed for this visit.   Subjective Assessment - 01/07/20 2026    Subjective  Pt. underwent right knee arthroscopic medial meniscectomy secondary to approximately 6 month history of right knee pain symptoms unresolved with conservative treatment efforts. Possible association of symptom onset with having to fend off stray dog that was attacking otherwise no overt mechanism of injury noted.    Pertinent History  left knee surgeries x 2    Limitations  Standing;Walking    Diagnostic tests  MRI, X-rays    Patient Stated Goals  Get right knee like left again     Currently in Pain?  Yes    Pain Score  2     Pain Location  Knee    Pain Orientation  Right    Pain Type  Surgical pain    Pain Onset  1 to 4 weeks ago    Aggravating Factors   walking, bending knee    Pain Relieving Factors  ice         [facility] PT Assessment - 01/07/20 0001      Assessment   Medical Diagnosis  s/p right knee partial medial meniscectomy    Referring Provider (PT)  Ophelia Charter, MD    Onset Date/Surgical Date  12/24/19    Hand Dominance  Left    Next MD Visit  4 weeks    Prior Therapy  none for right knee      Precautions   Precautions  None      Restrictions   Weight Bearing Restrictions  No      Balance Screen   Has the patient fallen in the past 6 months  Yes    How many times?  [place]  Spouse/significant other    Type of [place]    Additional Comments  2 steps without rail to enter from garage, 1 flight inside with right  rail      Prior Function   Level of Independence  Independent with community mobility without device    Vocation  --   manages hospital radiology department, working full duty     Cognition   Overall Cognitive Status  Within Functional Limits for tasks assessed      Observation/Other Assessments   Focus on Therapeutic Outcomes (FOTO)   42% limited      Observation/Other Assessments-Edema    Edema  Circumferential      Circumferential Edema   Circumferential - Right  40 cm    Circumferential - Left   37 cm      ROM / Strength   AROM / PROM / Strength  AROM;PROM;Strength      AROM   AROM Assessment Site  Knee    Right/Left Knee  Right;Left    Right Knee Extension  -2    Right Knee Flexion  95    Left Knee Extension  130    Left Knee Flexion  0      PROM   PROM Assessment Site  Knee    Right/Left Knee  Right    Right Knee Extension  -2    Right Knee Flexion  100      Strength   Strength Assessment Site  Hip;Knee    Right/Left  Hip  Right;Left    Right Hip Flexion  5/5    Right Hip External Rotation   5/5    Right Hip Internal Rotation  5/5    Left Hip Flexion  4/5    Right/Left Knee  Right;Left    Right Knee Flexion  5/5    Right Knee Extension  5/5    Left Knee Flexion  4/5    Left Knee Extension  4+/5      Flexibility   Soft Tissue Assessment /Muscle Length  --   mild gastroc tightness on right, SLR 90 deg     Palpation   Palpation comment  edema noted right knee and lower leg but calf is soft and non-tender, no warmth or erythema noted                Objective measurements completed on examination: See above findings.      [place] Adult PT Treatment/Exercise - 01/07/20 0001      Exercises   Exercises  Knee/Hip      Knee/Hip Exercises: Stretches   Gastroc Stretch Limitations  longsitting gastroc stretch 20 sec x 2    Other Knee/Hip Stretches  additional HEP instruction LAQ with alternatiing ankle PF/DF for edema, supine ankle pumps with legs propped and seated knee flexion stretch alternative to heel slide for ROM at work      Knee/Hip Exercises: Supine   Quad Sets  AROM;Strengthening;Right;10 reps    [employer] Limitations  towel roll under knee    Heel Slides Limitations  x 5 reps HEP practice with sheet use    Straight Leg Raises  AROM;Strengthening;Right;10 reps      Knee/Hip Exercises: Sidelying   Hip ABduction  AROM;Strengthening;Right;10 reps      Knee/Hip Exercises: Prone   Hip Extension  AROM;Strengthening;Right;10 reps      Modalities   Modalities  Cryotherapy      Cryotherapy   Number Minutes Cryotherapy  10 Minutes    Cryotherapy Location  Knee    Type of Cryotherapy  Ice pack             PT Education -  01/07/20 2018    Education Details  HEP, POC    Person(s) Educated  Patient    Methods  Explanation;Demonstration;Verbal cues;Handout    Comprehension  Returned demonstration;Verbalized understanding          PT Long Term Goals - 01/07/20 2024       PT LONG TERM GOAL #1   Title  Independent with HEP    Baseline  needs HEP    Time  6    Period  Weeks    Status  New    Target Date  02/18/20      PT LONG TERM GOAL #2   Title  Increase right knee flexion AROM at least 20 deg to improve ability to navigate stairs and bend knee for dressing, ADLs    Baseline  95 deg    Time  6    Period  Weeks    Status  New    Target Date  02/18/20      PT LONG TERM GOAL #3   Title  Right knee strength 5/5 to improve ability for stair navigation    Baseline  4/5    Time  6    Period  Weeks    Status  New    Target Date  02/18/20      PT LONG TERM GOAL #4   Title  Improve FOTO score to 25% or less impairment    Baseline  42% limited    Time  6    Period  Weeks    Status  New    Target Date  02/18/20      PT LONG TERM GOAL #5   Title  Pt. to tolerate standing and ambulation for community mobility and work duties with right knee pain </= 2/10    Time  6    Period  Weeks    Status  New    Target Date  02/18/20             Plan - 01/07/20 2019    Clinical Impression Statement  Pt. presents with right knee pain and stiffness, muscle weakness 2 weeks s/p arthrocopic medial meniscectomy. Some stiffness in knee flexion with associated edema but overall progressing well with mobility and minimal pain for current timeframe post-op and expect good prognosis for progress and outcome. Plan focus on transitioning to HEP with therapy visits.    Personal Factors and Comorbidities  Comorbidity 1    Comorbidities  left knee surgical history    Examination-Activity Limitations  Lift;Squat;Bend;Stairs;Locomotion Level    Examination-Participation Restrictions  Community Activity    Stability/Clinical Decision Making  Stable/Uncomplicated    Clinical Decision Making  Low    Rehab Potential  Excellent    PT Frequency  --   6 visits   PT Duration  6 weeks    PT Treatment/Interventions  ADLs/Self Care Home Management;Cryotherapy;Electrical  Stimulation;Therapeutic activities;Functional mobility training;Stair training;Gait training;Therapeutic exercise;Neuromuscular re-education;Patient/family education;Manual techniques;Passive range of motion;Vasopneumatic Device    PT Next Visit Plan  knee ROM focus flexion, trial recumbent bike, initial open chain exercise focus but plan add closed chain activities as tolerated.    PT Home Exercise Plan  heel slides with strap assist vs. seated knee flexion stretch at work, supine knee extension stretch, quad sets, 3-way SLR, gastroc/hamstring stretches, LAQ, supine ankle pumps with legs elevated or edema    Consulted and Agree with Plan of Care  Patient       Patient will benefit from skilled therapeutic  intervention in order to improve the following deficits and impairments:  Pain, Impaired flexibility, Decreased strength, Decreased activity tolerance, Increased edema, Decreased range of motion, Difficulty walking  Visit Diagnosis: Right knee pain, unspecified chronicity  Stiffness of right knee, not elsewhere classified  Localized edema     Problem List There are no problems to display for this patient.   Beaulah Dinning, PT, DPT 01/07/20 8:30 PM  [place], [place], [address] Phone: [phone]   Fax:  [phone]  Name: KELISE KOPERA MRN: [id number] Date of Birth: 04-Jul-1971

## 2020-01-08 ENCOUNTER — Encounter: Payer: Self-pay | Admitting: Physical Therapy

## 2020-01-08 ENCOUNTER — Ambulatory Visit: Payer: 59 | Admitting: Physical Therapy

## 2020-01-08 DIAGNOSIS — M25561 Pain in right knee: Secondary | ICD-10-CM

## 2020-01-08 DIAGNOSIS — R600 Localized edema: Secondary | ICD-10-CM

## 2020-01-08 DIAGNOSIS — M25661 Stiffness of right knee, not elsewhere classified: Secondary | ICD-10-CM | POA: Diagnosis not present

## 2020-01-08 NOTE — Therapy (Signed)
[place], [place], [address] Phone: [phone]   Fax:  [phone]  Physical Therapy Treatment  Patient Details  Name: Heather Ray MRN: [id number] Date of Birth: 1971/01/09 Referring Provider (PT): Ophelia Charter, MD   Encounter Date: 01/08/2020  PT End of Session - 01/08/20 0718    Visit Number  2    Number of Visits  6    Date for PT Re-Evaluation  02/18/20    Authorization Type  [facility]    PT Start Time  0714    PT Stop Time  0811    PT Time Calculation (min)  57 min    Activity Tolerance  Patient tolerated treatment well    Behavior During Therapy  [facility] for tasks assessed/performed       Past Medical History:  Diagnosis Date  . Fibroids     Past Surgical History:  Procedure Laterality Date  . CESAREAN SECTION    . KNEE ARTHROPLASTY    . KNEE ARTHROSCOPY WITH MEDIAL MENISECTOMY Left 01/02/2019   Procedure: LEFT KNEE ARTHROSCOPY WITH PARTIAL MEDIAL MENISECTOMY AND PATELLAR CHONDROPLASTY;  Surgeon: Hiram Gash, MD;  Location: [place];  Service: Orthopedics;  Laterality: Left;  . KNEE ARTHROSCOPY WITH MEDIAL MENISECTOMY Right 12/24/2019   Procedure: RIGHT KNEE ARTHROSCOPY WITH MEDIAL MENISECTOMY;  Surgeon: Hiram Gash, MD;  Location: [place];  Service: Orthopedics;  Laterality: Right;    There were no vitals filed for this visit.  Subjective Assessment - 01/08/20 0711    Subjective  Soreness with bending knee. Knee felt "looser" after leaving eval session yesterday.         [facility] PT Assessment - 01/08/20 0001      AROM   Right Knee Flexion  110                   [facility] Adult PT Treatment/Exercise - 01/08/20 0001      Knee/Hip Exercises: Stretches   Passive Hamstring Stretch  Right;3 reps;30 seconds    Gastroc Stretch  Right;3 reps;30 seconds      Knee/Hip Exercises: Aerobic   Recumbent Bike  L1 x6 min   initial partial revolutions  progressed to full revolutions     Knee/Hip Exercises: Standing   Heel Raises  Both;20 reps    Heel Raises Limitations  on [employer]    Terminal Knee Extension  AROM;Strengthening;Left;20 reps    Theraband Level (Terminal Knee Extension)  Level 3 (Green)    Hip Abduction  AROM;Stengthening;Right;20 reps    Abduction Limitations  2 lbs.      Knee/Hip Exercises: Seated   Long Arc Quad  AROM;Strengthening;Right;2 sets;10 reps      Knee/Hip Exercises: Supine   Quad Sets  AROM;Strengthening;Right;20 reps    Quad Sets Limitations  towel roll under knee    Short Arc Quad Sets  AROM;Strengthening;Right;20 reps    Bridges  AROM;Strengthening;Both;15 reps    Bridges Limitations  legs on reversed incline wedge    Straight Leg Raises  AROM;Strengthening;Right;2 sets;10 reps      Modalities   Modalities  Vasopneumatic      Vasopneumatic   Number Minutes Vasopneumatic   15 minutes    Vasopnuematic Location   Knee    Vasopneumatic Pressure  Low    Vasopneumatic Temperature   34      Manual Therapy   Manual Therapy  Passive ROM    Manual therapy comments  patellar  mobilization    Passive ROM  right knee flexion in sitting and supine, right knee extension in supine             PT Education - 01/08/20 0742    Education Details  POC    Person(s) Educated  Patient    Methods  Explanation    Comprehension  Verbalized understanding          PT Long Term Goals - 01/08/20 0751      PT LONG TERM GOAL #1   Title  Independent with HEP    Baseline  instructed at eval, will update prn    Time  6    Period  Weeks    Status  On-going      PT LONG TERM GOAL #2   Title  Increase right knee flexion AROM at least 20 deg to improve ability to navigate stairs and bend knee for dressing, ADLs    Baseline  95 deg at eval, 110 deg today    Time  6    Period  Weeks    Status  On-going      PT LONG TERM GOAL #3   Title  Right knee strength 5/5 to improve ability for stair navigation     Baseline  4/5    Time  6    Period  Weeks    Status  On-going      PT LONG TERM GOAL #4   Title  Improve FOTO score to 25% or less impairment    Baseline  42% limited    Time  6      PT LONG TERM GOAL #5   Title  Pt. to tolerate standing and ambulation for community mobility and work duties with right knee pain </= 2/10    Time  6    Period  Weeks    Status  On-going            Plan - 01/08/20 0743    Clinical Impression Statement  Good progress from last session with decreased knee stiffness in flexion. Added light CKC activities with heel raises and TKE otherwise continued open chain focus. Progressing well with therapy goals.    Personal Factors and Comorbidities  Comorbidity 1    Comorbidities  left knee surgical history    Examination-Activity Limitations  Lift;Squat;Bend;Stairs;Locomotion Level    Examination-Participation Restrictions  Community Activity    Stability/Clinical Decision Making  Stable/Uncomplicated    Clinical Decision Making  Low    Rehab Potential  Excellent    PT Frequency  --   6 visits   PT Duration  6 weeks    PT Treatment/Interventions  ADLs/Self Care Home Management;Cryotherapy;Electrical Stimulation;Therapeutic activities;Functional mobility training;Stair training;Gait training;Therapeutic exercise;Neuromuscular re-education;Patient/family education;Manual techniques;Passive range of motion;Vasopneumatic Device    PT Next Visit Plan  progress CKC activites with partial squat, step ups, potential mini linges pending tolerance, continue knee ROM focus, update HEP    PT Home Exercise Plan  heel slides with strap assist vs. seated knee flexion stretch at work, supine knee extension stretch, quad sets, 3-way SLR, gastroc/hamstring stretches, LAQ, supine ankle pumps with legs elevated or edema    Consulted and Agree with Plan of Care  Patient       Patient will benefit from skilled therapeutic intervention in order to improve the following deficits  and impairments:  Pain, Impaired flexibility, Decreased strength, Decreased activity tolerance, Increased edema, Decreased range of motion, Difficulty walking  Visit Diagnosis: Right knee pain,  unspecified chronicity  Stiffness of right knee, not elsewhere classified  Localized edema     Problem List There are no problems to display for this patient.   Beaulah Dinning, PT, DPT 01/08/20 7:57 AM  [facility] [address] [place], [place], [address] Phone: [phone]   Fax:  [phone]  Name: Heather Ray MRN: [id number] Date of Birth: 06/01/1971

## 2020-01-13 ENCOUNTER — Ambulatory Visit: Payer: 59 | Admitting: Physical Therapy

## 2020-01-22 ENCOUNTER — Other Ambulatory Visit: Payer: Self-pay

## 2020-01-22 ENCOUNTER — Encounter: Payer: Self-pay | Admitting: Physical Therapy

## 2020-01-22 ENCOUNTER — Ambulatory Visit: Payer: 59 | Attending: Orthopaedic Surgery | Admitting: Physical Therapy

## 2020-01-22 DIAGNOSIS — M25561 Pain in right knee: Secondary | ICD-10-CM | POA: Diagnosis not present

## 2020-01-22 DIAGNOSIS — M25661 Stiffness of right knee, not elsewhere classified: Secondary | ICD-10-CM | POA: Diagnosis not present

## 2020-01-22 DIAGNOSIS — R600 Localized edema: Secondary | ICD-10-CM | POA: Diagnosis not present

## 2020-01-22 NOTE — Therapy (Signed)
[place], [place], [address] Phone: [phone]   Fax:  [phone]  Physical Therapy Treatment  Patient Details  Name: Heather Ray MRN: [id number] Date of Birth: [birth year] Referring Provider (PT): Ophelia Charter, MD   Encounter Date: 01/22/2020  PT End of Session - 01/22/20 0823    Visit Number  3    Number of Visits  6    Date for PT Re-Evaluation  02/18/20    Authorization Type  [facility]    PT Start Time  0800    PT Stop Time  0842    PT Time Calculation (min)  42 min    Activity Tolerance  Patient tolerated treatment well    Behavior During Therapy  [facility] for tasks assessed/performed       Past Medical History:  Diagnosis Date  . Fibroids     Past Surgical History:  Procedure Laterality Date  . CESAREAN SECTION    . KNEE ARTHROPLASTY    . KNEE ARTHROSCOPY WITH MEDIAL MENISECTOMY Left 01/02/2019   Procedure: LEFT KNEE ARTHROSCOPY WITH PARTIAL MEDIAL MENISECTOMY AND PATELLAR CHONDROPLASTY;  Surgeon: Hiram Gash, MD;  Location: [place];  Service: Orthopedics;  Laterality: Left;  . KNEE ARTHROSCOPY WITH MEDIAL MENISECTOMY Right 12/24/2019   Procedure: RIGHT KNEE ARTHROSCOPY WITH MEDIAL MENISECTOMY;  Surgeon: Hiram Gash, MD;  Location: [place];  Service: Orthopedics;  Laterality: Right;    There were no vitals filed for this visit.  Subjective Assessment - 01/22/20 0803    Subjective  No pain pre-tx. Pt. does report had fall last Sunday where she slipped on snowy ground. Forcibly bended knee and had some initial soreness now improving. Has been doing bike at gym.    Currently in Pain?  No/denies         [facility] PT Assessment - 01/22/20 0001      AROM   Right Knee Extension  -2    Right Knee Flexion  122                   [facility] Adult PT Treatment/Exercise - 01/22/20 0001      Knee/Hip Exercises: Stretches   Passive Hamstring Stretch   Right;3 reps;20 seconds      Knee/Hip Exercises: Aerobic   Recumbent Bike  L2 x 6 min      Knee/Hip Exercises: Field seismologist for Strengthening   Cybex Leg Press  Bilat. LE 25 lbs. 2x10      Knee/Hip Exercises: Standing   Forward Lunges  Right;1 set;10 reps    Forward Lunges Limitations  front partial lunge    Lateral Step Up  Right;2 sets;10 reps;Hand Hold: 2;Step Height: 4"    Forward Step Up  Right;2 sets;20 reps;Hand Hold: 1;Step Height: 4"    Functional Squat Limitations  1x10 partial squat at counter, 1 x 10 wall sit partial squat, 1 x 10 TRX partial squat    Other Standing Knee Exercises  Monster walk blut band proximal to knees 15 feet x 4      Knee/Hip Exercises: Supine   Bridges  AROM;Strengthening;Both;2 sets;10 reps      Manual Therapy   Manual therapy comments  brief right knee extension mobs grade I-III                  PT Long Term Goals - 01/08/20 0751      PT LONG TERM GOAL #1   Title  Independent  with HEP    Baseline  instructed at eval, will update prn    Time  6    Period  Weeks    Status  On-going      PT LONG TERM GOAL #2   Title  Increase right knee flexion AROM at least 20 deg to improve ability to navigate stairs and bend knee for dressing, ADLs    Baseline  95 deg at eval, 110 deg today    Time  6    Period  Weeks    Status  On-going      PT LONG TERM GOAL #3   Title  Right knee strength 5/5 to improve ability for stair navigation    Baseline  4/5    Time  6    Period  Weeks    Status  On-going      PT LONG TERM GOAL #4   Title  Improve FOTO score to 25% or less impairment    Baseline  42% limited    Time  6      PT LONG TERM GOAL #5   Title  Pt. to tolerate standing and ambulation for community mobility and work duties with right knee pain </= 2/10    Time  6    Period  Weeks    Status  On-going            Plan - 01/22/20 [id number]    Clinical Impression Statement  Pt. progressing well with right knee ROM gains and  functional status s/p surgery. Some concern re: fall but givem symptoms improved plan monitior for now. Progressed HEP with closed chain activities with good tolerance-plan for pt. to continue with HEP and follow up only if needed for further HEP review and progression.    Personal Factors and Comorbidities  Comorbidity 1    Comorbidities  left knee surgical history    Examination-Activity Limitations  Lift;Squat;Bend;Stairs;Locomotion Level    Examination-Participation Restrictions  Community Activity    Stability/Clinical Decision Making  Stable/Uncomplicated    Clinical Decision Making  Low    Rehab Potential  Excellent    PT Frequency  --   6 visits   PT Duration  6 weeks    PT Treatment/Interventions  ADLs/Self Care Home Management;Cryotherapy;Electrical Stimulation;Therapeutic activities;Functional mobility training;Stair training;Gait training;Therapeutic exercise;Neuromuscular re-education;Patient/family education;Manual techniques;Passive range of motion;Vasopneumatic Device    PT Next Visit Plan  review/progress HEP as needed with closed chain/functional activities    PT Home Exercise Plan  Partial squats, forward and lateral step ups, partial lunge, hip bridge, monster walks, continue heel slides, knee ext ROM, gastroc and hamstrings stretches    Consulted and Agree with Plan of Care  Patient       Patient will benefit from skilled therapeutic intervention in order to improve the following deficits and impairments:  Pain, Impaired flexibility, Decreased strength, Decreased activity tolerance, Increased edema, Decreased range of motion, Difficulty walking  Visit Diagnosis: Right knee pain, unspecified chronicity  Stiffness of right knee, not elsewhere classified  Localized edema     Problem List There are no problems to display for this patient.   Beaulah Dinning, PT, DPT 01/22/20 8:50 AM  [facility] [address] [place], [place], [address] Phone: [phone]   Fax:  [phone]  Name: Heather Ray MRN: [id number] Date of Birth: 1971/02/05

## 2020-02-05 DIAGNOSIS — S83241D Other tear of medial meniscus, current injury, right knee, subsequent encounter: Secondary | ICD-10-CM | POA: Diagnosis not present

## 2020-02-18 NOTE — Therapy (Signed)
[place], [place], [address] Phone: [phone]   Fax:  [phone]  Physical Therapy Treatment/Discharge  Patient Details  Name: Heather Ray MRN: [id number] Date of Birth: [birth year] Referring Provider (PT): Ophelia Charter, MD   Encounter Date: 01/22/2020    Past Medical History:  Diagnosis Date  . Fibroids     Past Surgical History:  Procedure Laterality Date  . CESAREAN SECTION    . KNEE ARTHROPLASTY    . KNEE ARTHROSCOPY WITH MEDIAL MENISECTOMY Left 01/02/2019   Procedure: LEFT KNEE ARTHROSCOPY WITH PARTIAL MEDIAL MENISECTOMY AND PATELLAR CHONDROPLASTY;  Surgeon: Hiram Gash, MD;  Location: [place];  Service: Orthopedics;  Laterality: Left;  . KNEE ARTHROSCOPY WITH MEDIAL MENISECTOMY Right 12/24/2019   Procedure: RIGHT KNEE ARTHROSCOPY WITH MEDIAL MENISECTOMY;  Surgeon: Hiram Gash, MD;  Location: [place];  Service: Orthopedics;  Laterality: Right;    There were no vitals filed for this visit.                                 PT Long Term Goals - 01/08/20 0751      PT LONG TERM GOAL #1   Title  Independent with HEP    Baseline  instructed at eval, will update prn    Time  6    Period  Weeks    Status  On-going      PT LONG TERM GOAL #2   Title  Increase right knee flexion AROM at least 20 deg to improve ability to navigate stairs and bend knee for dressing, ADLs    Baseline  95 deg at eval, 110 deg today    Time  6    Period  Weeks    Status  On-going      PT LONG TERM GOAL #3   Title  Right knee strength 5/5 to improve ability for stair navigation    Baseline  4/5    Time  6    Period  Weeks    Status  On-going      PT LONG TERM GOAL #4   Title  Improve FOTO score to 25% or less impairment    Baseline  42% limited    Time  6      PT LONG TERM GOAL #5   Title  Pt. to tolerate standing and ambulation for  community mobility and work duties with right knee pain </= 2/10    Time  6    Period  Weeks    Status  On-going              Patient will benefit from skilled therapeutic intervention in order to improve the following deficits and impairments:  Pain, Impaired flexibility, Decreased strength, Decreased activity tolerance, Increased edema, Decreased range of motion, Difficulty walking  Visit Diagnosis: Right knee pain, unspecified chronicity  Stiffness of right knee, not elsewhere classified  Localized edema     Problem List There are no problems to display for this patient.    PHYSICAL THERAPY DISCHARGE SUMMARY  Visits from Start of Care: 3  Current functional level related to goals / functional outcomes: Patient not seen for therapy since 01/22/20-at the time she was progressing well and HEP was updated with plan to return to therapy only if needed. No further formal therapy visits planned at this time.   Remaining deficits: NA  Education / Equipment: HEP Plan: Patient agrees to discharge.  Patient goals were partially met. Patient is being discharged due to meeting the stated rehab goals.  ?????          Beaulah Dinning, PT, DPT 02/18/20 4:24 PM     [facility] [address] [place], [place], [address] Phone: [phone]   Fax:  [phone]  Name: Heather Ray MRN: [id number] Date of Birth: 02/16/1971

## 2020-02-23 MED FILL — LARIN FE 1.5-30 TABLET: 1.5-30 | 63 days supply | Qty: 84 | Fill #3

## 2020-02-26 ENCOUNTER — Encounter (HOSPITAL_COMMUNITY): Payer: Self-pay

## 2020-02-26 ENCOUNTER — Other Ambulatory Visit: Payer: Self-pay

## 2020-02-26 ENCOUNTER — Ambulatory Visit (HOSPITAL_COMMUNITY)
Admission: EM | Admit: 2020-02-26 | Discharge: 2020-02-26 | Disposition: A | Discharge status: Home / Self Care | Payer: 59 | Attending: Emergency Medicine | Admitting: Emergency Medicine

## 2020-02-26 DIAGNOSIS — H6121 Impacted cerumen, right ear: Secondary | ICD-10-CM | POA: Diagnosis not present

## 2020-02-26 NOTE — ED Provider Notes (Signed)
Heather Ray    CSN: [id number] Arrival date & time: 02/26/20  [id number]      History   Chief Complaint Chief Complaint  Patient presents with  . Ear Fullness    Bilateral    HPI Heather Ray is a 49 y.o. female.   Heather Ray presents with complaints of ear fullness, R>L which has been ongoing off and on for the past week or so. Some nasal drainage and congestion. Decreased hearing. No drainage from the ear. No necessarily painful and feels like the symptoms improve as day goes on. She tried over the counter wax removal last night which did not seem to help much. 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 No fevers. No cough. No headache .    ROS per HPI, negative if not otherwise mentioned.      Past Medical History:  Diagnosis Date  . Fibroids     There are no problems to display for this patient.   Past Surgical History:  Procedure Laterality Date  . CESAREAN SECTION    . KNEE ARTHROPLASTY    . KNEE ARTHROSCOPY WITH MEDIAL MENISECTOMY Left 01/02/2019   Procedure: LEFT KNEE ARTHROSCOPY WITH PARTIAL MEDIAL MENISECTOMY AND PATELLAR CHONDROPLASTY;  Surgeon: Hiram Gash, MD;  Location: [place];  Service: Orthopedics;  Laterality: Left;  . KNEE ARTHROSCOPY WITH MEDIAL MENISECTOMY Right 12/24/2019   Procedure: RIGHT KNEE ARTHROSCOPY WITH MEDIAL MENISECTOMY;  Surgeon: Hiram Gash, MD;  Location: [place];  Service: Orthopedics;  Laterality: Right;    OB History   No obstetric history on file.      Home Medications    Prior to Admission medications   Medication Sig Start Date End Date Taking? Authorizing Provider  aspirin (ASPIRIN CHILDRENS) 81 MG chewable tablet Chew 1 tablet (81 mg total) by mouth 2 (two) times daily. Patient not taking: Reported on 01/07/2020 12/24/19 12/23/20  Ethelda Chick, PA-C  meloxicam (MOBIC) 7.5 MG tablet Take 1 tablet  (7.5 mg total) by mouth daily. Patient not taking: Reported on 01/07/2020 12/24/19 12/23/20  Ethelda Chick, PA-C  Multiple Vitamin (MULTIVITAMIN ADULT PO) Take by mouth.    [provider]  norethindrone-ethinyl estradiol Ronnette Juniper 1/20) 1-20 MG-MCG tablet Take 1 tablet by mouth daily.    [provider]    Family History Family History  Problem Relation Age of Onset  . Breast cancer Mother 34    Social History Social History   Tobacco Use  . Smoking status: Never Smoker  . Smokeless tobacco: Never Used  Substance Use Topics  . Alcohol use: Never  . Drug use: Never     Allergies   Patient has no known allergies.   Review of Systems Review of Systems   Physical Exam Triage Vital Signs ED Triage Vitals [02/26/20 0846]  Enc Vitals Group     BP (!) 156/89     Pulse Rate 75     Resp 20     Temp 97.9 F (36.6 C)     Temp Source Oral     SpO2 99 %     Weight      Height      Head Circumference      Peak Flow      Pain Score 0     Pain Loc      Pain Edu?      Excl. in  GC?    No data found.  Updated Vital Signs BP (!) 156/89 (BP Location: Left Arm)   Pulse 75   Temp 97.9 F (36.6 C) (Oral)   Resp 20   SpO2 99%   Visual Acuity Right Eye Distance:   Left Eye Distance:   Bilateral Distance:    Right Eye Near:   Left Eye Near:    Bilateral Near:     Physical Exam Constitutional:      General: She is not in acute distress.    Appearance: She is well-developed.  HENT:     Right Ear: There is impacted cerumen.     Left Ear: There is impacted cerumen.     Ears:     Comments: Irrigated by Engineer, civil (consulting) with clearance of left canal; still with moderate cerumen to right canal; currette used to remove additional cerumen although unable to completely clear canal; still partial right TM occlusion by cerumen Cardiovascular:     Rate and Rhythm: Normal rate.  Pulmonary:     Effort: Pulmonary effort is normal.  Skin:    General: Skin is warm  and dry.  Neurological:     Mental Status: She is alert and oriented to person, place, and time.      UC Treatments / Results  Labs (all labs ordered are listed, but only abnormal results are displayed) Labs Reviewed - No data to display  EKG   Radiology No results found.  Procedures Procedures (including critical care time)  Medications Ordered in UC Medications - No data to display  Initial Impression / Assessment and Plan / UC Course  I have reviewed the triage vital signs and the nursing notes.  Pertinent labs & imaging results that were available during my care of the patient were reviewed by me and considered in my medical decision making (see chart for details).     Patient reports feeling much improved, still with some cerumen, however, despite nursing irration and use of curette for clearance by this provided. Encouraged continued use of debrox drops to soften the wax and irrigate again in another 2-3 days. If symptoms worsen or do not improve in the next week to return to be seen or to follow up with PCP.  Patient verbalized understanding and agreeable to plan.   Final Clinical Impressions(s) / UC Diagnoses   Final diagnoses:  Impacted cerumen of right ear     Discharge Instructions     We were able to get out a significant amount of ear wax, your right ear still has some remaining.  I recommend using debrox drops still for the next 2-3 days to continue to soften the wax and then irrigate it with the bulb syringe in approximately 2-3 days to further remove wax. Don't use qtips which can further impact the wax.  Return to be seen if any further pain or complications.     ED Prescriptions    None     PDMP not reviewed this encounter.   Zigmund Gottron, NP 02/26/20 1008

## 2020-02-26 NOTE — ED Triage Notes (Signed)
Pt presents with bilateral ear fullness X 1 week.

## 2020-02-26 NOTE — Discharge Instructions (Signed)
We were able to get out a significant amount of ear wax, your right ear still has some remaining.  I recommend using debrox drops still for the next 2-3 days to continue to soften the wax and then irrigate it with the bulb syringe in approximately 2-3 days to further remove wax. Don't use qtips which can further impact the wax.  Return to be seen if any further pain or complications.

## 2020-07-06 DIAGNOSIS — R1030 Lower abdominal pain, unspecified: Secondary | ICD-10-CM | POA: Diagnosis not present

## 2020-07-06 DIAGNOSIS — Z01419 Encounter for gynecological examination (general) (routine) without abnormal findings: Secondary | ICD-10-CM | POA: Diagnosis not present

## 2020-07-06 DIAGNOSIS — Z1151 Encounter for screening for human papillomavirus (HPV): Secondary | ICD-10-CM | POA: Diagnosis not present

## 2020-07-06 DIAGNOSIS — Z8619 Personal history of other infectious and parasitic diseases: Secondary | ICD-10-CM | POA: Diagnosis not present

## 2020-07-06 DIAGNOSIS — Z793 Long term (current) use of hormonal contraceptives: Secondary | ICD-10-CM | POA: Diagnosis not present

## 2020-07-06 DIAGNOSIS — R140 Abdominal distension (gaseous): Secondary | ICD-10-CM | POA: Diagnosis not present

## 2020-07-06 DIAGNOSIS — N921 Excessive and frequent menstruation with irregular cycle: Secondary | ICD-10-CM | POA: Diagnosis not present

## 2020-07-06 DIAGNOSIS — Z124 Encounter for screening for malignant neoplasm of cervix: Secondary | ICD-10-CM | POA: Diagnosis not present

## 2020-07-06 MED FILL — IBUPROFEN 800 MG TABS: 800 | 4 days supply | Qty: 15 | Fill #0

## 2020-07-06 MED FILL — miSOPROStol 200 MCG TABS: 200 | 2 days supply | Qty: 2 | Fill #0

## 2020-07-06 MED FILL — DOXYCYCLINE HYCLATE 100 MG: 100 | 1 days supply | Qty: 2 | Fill #0

## 2020-07-20 MED FILL — miSOPROStol 200 MCG TABS: 200 | 2 days supply | Qty: 2 | Fill #0

## 2020-07-20 MED FILL — LARIN FE 1.5-30 TABLET: 1.5-30 | 63 days supply | Qty: 84 | Fill #1

## 2020-07-20 MED FILL — DOXYCYCLINE HYCLATE 100 MG: 100 | 1 days supply | Qty: 2 | Fill #0

## 2020-07-20 MED FILL — IBUPROFEN 800 MG TABS: 800 | 4 days supply | Qty: 15 | Fill #0

## 2020-08-04 ENCOUNTER — Ambulatory Visit
Admission: RE | Admit: 2020-08-04 | Discharge: 2020-08-04 | Disposition: A | Discharge status: Home / Self Care | Payer: 59 | Source: Ambulatory Visit | Attending: Obstetrics and Gynecology | Admitting: Obstetrics and Gynecology

## 2020-08-04 ENCOUNTER — Other Ambulatory Visit: Payer: Self-pay

## 2020-08-04 ENCOUNTER — Other Ambulatory Visit: Payer: Self-pay | Admitting: Obstetrics and Gynecology

## 2020-08-04 DIAGNOSIS — R140 Abdominal distension (gaseous): Secondary | ICD-10-CM | POA: Diagnosis not present

## 2020-08-04 DIAGNOSIS — Z1231 Encounter for screening mammogram for malignant neoplasm of breast: Secondary | ICD-10-CM

## 2020-08-04 DIAGNOSIS — Z0000 Encounter for general adult medical examination without abnormal findings: Secondary | ICD-10-CM | POA: Diagnosis not present

## 2020-08-04 DIAGNOSIS — D251 Intramural leiomyoma of uterus: Secondary | ICD-10-CM | POA: Diagnosis not present

## 2020-08-04 DIAGNOSIS — Z3041 Encounter for surveillance of contraceptive pills: Secondary | ICD-10-CM | POA: Diagnosis not present

## 2020-08-04 DIAGNOSIS — N938 Other specified abnormal uterine and vaginal bleeding: Secondary | ICD-10-CM | POA: Diagnosis not present

## 2020-08-04 DIAGNOSIS — Z3202 Encounter for pregnancy test, result negative: Secondary | ICD-10-CM | POA: Diagnosis not present

## 2020-08-04 MED FILL — LARISSIA 0.1-20 MG-MCG TABS: 0.1-20 | 84 days supply | Qty: 84 | Fill #0

## 2020-10-31 DIAGNOSIS — H40013 Open angle with borderline findings, low risk, bilateral: Secondary | ICD-10-CM | POA: Diagnosis not present

## 2020-11-17 ENCOUNTER — Other Ambulatory Visit (HOSPITAL_COMMUNITY): Payer: Self-pay | Admitting: Obstetrics and Gynecology

## 2020-11-17 MED FILL — LARIN FE 1.5-30 TABLET: 1.5-30 | 84 days supply | Qty: 84 | Fill #0

## 2021-02-14 ENCOUNTER — Other Ambulatory Visit (HOSPITAL_COMMUNITY): Payer: Self-pay | Admitting: Obstetrics and Gynecology

## 2021-02-14 MED FILL — LARIN FE 1.5-30 TABLET: 1.5-30 | 84 days supply | Qty: 84 | Fill #0

## 2021-04-04 ENCOUNTER — Other Ambulatory Visit (HOSPITAL_COMMUNITY): Payer: Self-pay

## 2021-04-04 MED ORDER — NORETHIN ACE-ETH ESTRAD-FE 1.5-30 MG-MCG PO TABS
1.0000 | ORAL_TABLET | Freq: Every day | ORAL | 1 refills | Status: AC
  Filled 2021-04-04 – 2021-04-24 (×3): qty 84, 84d supply, fill #0
  Filled 2021-07-10: qty 84, 84d supply, fill #1

## 2021-04-10 ENCOUNTER — Other Ambulatory Visit (HOSPITAL_COMMUNITY): Payer: Self-pay

## 2021-04-24 ENCOUNTER — Other Ambulatory Visit (HOSPITAL_COMMUNITY): Payer: Self-pay

## 2021-04-25 ENCOUNTER — Other Ambulatory Visit (HOSPITAL_COMMUNITY): Payer: Self-pay

## 2021-07-07 ENCOUNTER — Other Ambulatory Visit (HOSPITAL_COMMUNITY): Payer: Self-pay

## 2021-07-10 ENCOUNTER — Other Ambulatory Visit (HOSPITAL_COMMUNITY): Payer: Self-pay

## 2021-07-28 ENCOUNTER — Other Ambulatory Visit (HOSPITAL_COMMUNITY): Payer: Self-pay

## 2021-07-28 DIAGNOSIS — Z1151 Encounter for screening for human papillomavirus (HPV): Secondary | ICD-10-CM | POA: Diagnosis not present

## 2021-07-28 DIAGNOSIS — Z01419 Encounter for gynecological examination (general) (routine) without abnormal findings: Secondary | ICD-10-CM | POA: Diagnosis not present

## 2021-07-28 DIAGNOSIS — N951 Menopausal and female climacteric states: Secondary | ICD-10-CM | POA: Diagnosis not present

## 2021-07-28 DIAGNOSIS — R635 Abnormal weight gain: Secondary | ICD-10-CM | POA: Diagnosis not present

## 2021-07-28 MED ORDER — NORETHINDRONE ACET-ETHINYL EST 1-20 MG-MCG PO TABS
1.0000 | ORAL_TABLET | Freq: Every day | ORAL | 3 refills | Status: AC
  Filled 2021-07-28: qty 84, 84d supply, fill #0
  Filled 2021-12-19: qty 84, 84d supply, fill #1
  Filled 2022-03-06: qty 84, 84d supply, fill #2
  Filled 2022-06-16: qty 84, 84d supply, fill #3

## 2021-10-21 ENCOUNTER — Ambulatory Visit: Payer: 59

## 2021-10-21 ENCOUNTER — Other Ambulatory Visit: Payer: Self-pay

## 2021-10-21 ENCOUNTER — Ambulatory Visit
Admission: RE | Admit: 2021-10-21 | Discharge: 2021-10-21 | Disposition: A | Discharge status: Home / Self Care | Payer: 59 | Source: Ambulatory Visit | Attending: Obstetrics and Gynecology | Admitting: Obstetrics and Gynecology

## 2021-10-21 ENCOUNTER — Other Ambulatory Visit: Payer: Self-pay | Admitting: Obstetrics and Gynecology

## 2021-10-21 DIAGNOSIS — Z1231 Encounter for screening mammogram for malignant neoplasm of breast: Secondary | ICD-10-CM | POA: Diagnosis not present

## 2021-12-20 ENCOUNTER — Other Ambulatory Visit (HOSPITAL_COMMUNITY): Payer: Self-pay

## 2022-03-06 ENCOUNTER — Other Ambulatory Visit (HOSPITAL_COMMUNITY): Payer: Self-pay

## 2022-03-13 ENCOUNTER — Telehealth: Payer: 59 | Admitting: Physician Assistant

## 2022-03-13 DIAGNOSIS — B9789 Other viral agents as the cause of diseases classified elsewhere: Secondary | ICD-10-CM

## 2022-03-13 DIAGNOSIS — J019 Acute sinusitis, unspecified: Secondary | ICD-10-CM

## 2022-03-13 MED ORDER — FLUTICASONE PROPIONATE 50 MCG/ACT NA SUSP: 2.0000 | Freq: Every day | NASAL | 0 refills | Status: AC

## 2022-03-13 NOTE — Progress Notes (Signed)
I have spent 5 minutes in review of e-visit questionnaire, review and updating patient chart, medical decision making and response to patient.   Leanda Padmore Cody Calem Cocozza, PA-C    

## 2022-03-13 NOTE — Progress Notes (Signed)

## 2022-03-14 DIAGNOSIS — H6122 Impacted cerumen, left ear: Secondary | ICD-10-CM | POA: Diagnosis not present

## 2022-03-14 DIAGNOSIS — H6121 Impacted cerumen, right ear: Secondary | ICD-10-CM | POA: Diagnosis not present

## 2022-03-14 DIAGNOSIS — J019 Acute sinusitis, unspecified: Secondary | ICD-10-CM | POA: Diagnosis not present

## 2022-03-14 DIAGNOSIS — H6123 Impacted cerumen, bilateral: Secondary | ICD-10-CM | POA: Diagnosis not present

## 2022-03-14 DIAGNOSIS — H9202 Otalgia, left ear: Secondary | ICD-10-CM | POA: Diagnosis not present

## 2022-06-18 ENCOUNTER — Other Ambulatory Visit (HOSPITAL_COMMUNITY): Payer: Self-pay

## 2022-08-18 IMAGING — MG MM DIGITAL SCREENING BILAT W/ TOMO AND CAD
8 series · 9 of 24 positions shown · non-contrast
Comparison: Previous exam(s).

CLINICAL DATA: Screening.

EXAM:
DIGITAL SCREENING BILATERAL MAMMOGRAM WITH TOMOSYNTHESIS AND CAD
TECHNIQUE: Bilateral screening digital craniocaudal and mediolateral oblique
mammograms were obtained. Bilateral screening digital breast
tomosynthesis was performed. The images were evaluated with
computer-aided detection.

[R MLO synth-2D]
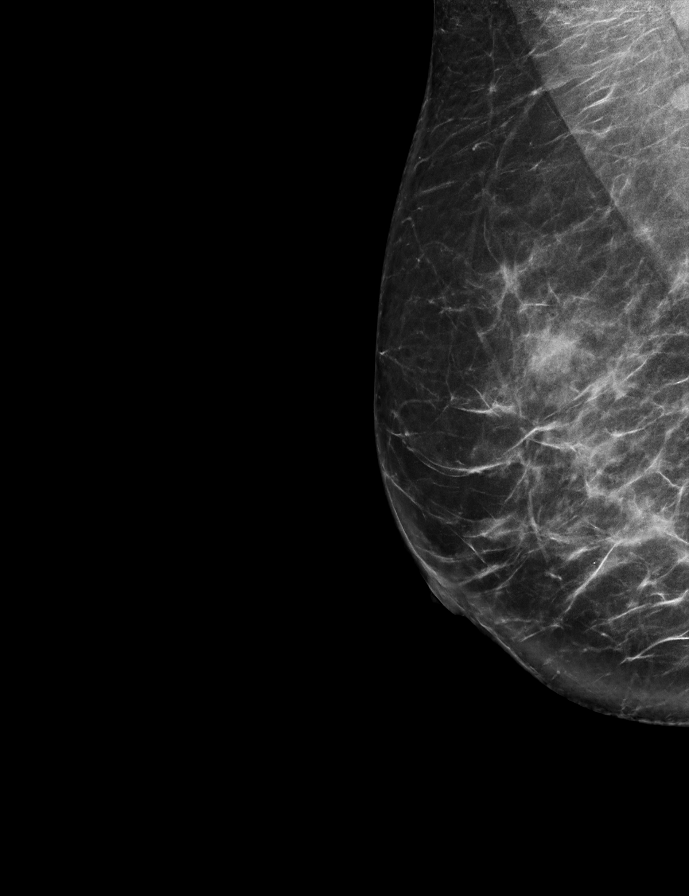

[R CC synth-2D]
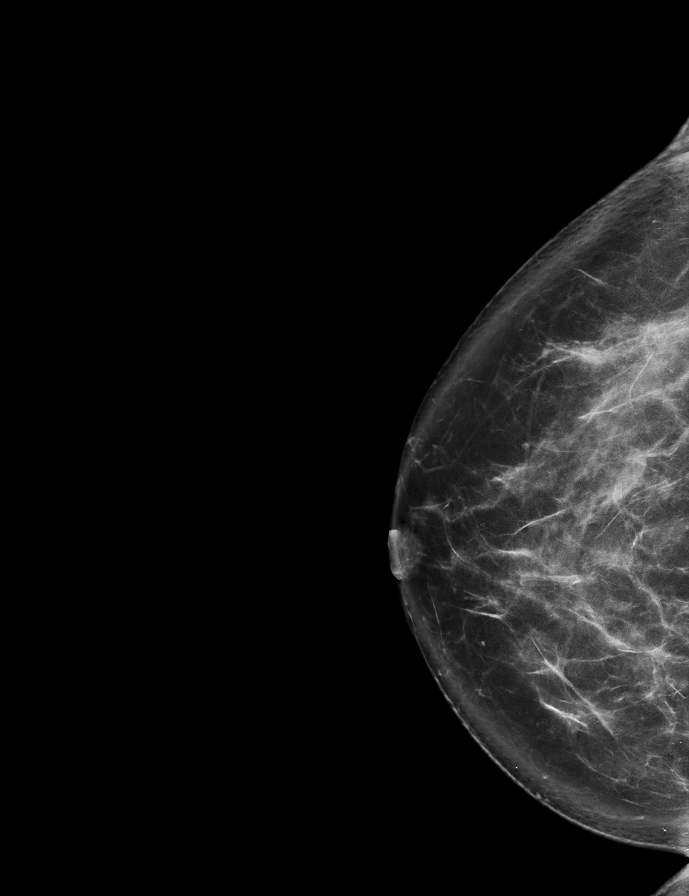

[L CC synth-2D]
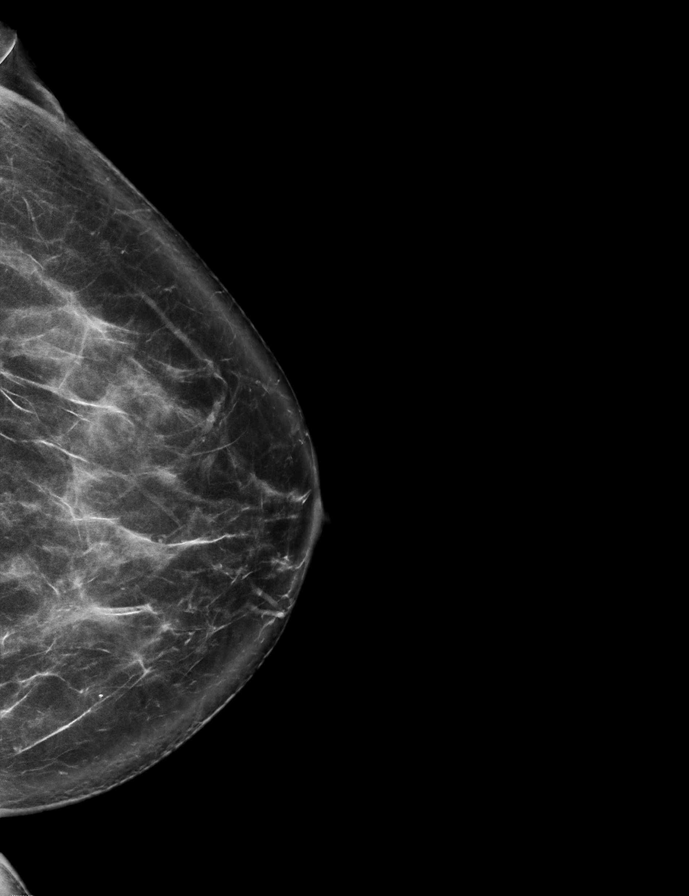

[L MLO synth-2D]
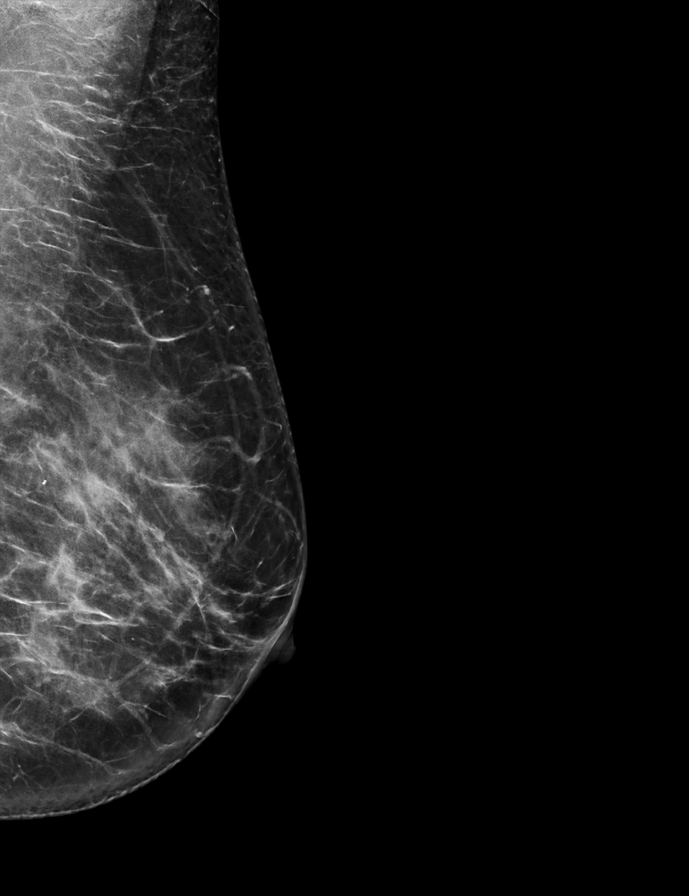

[L CC tomo · 2 of 79 frames shown]
[frame 26/79]
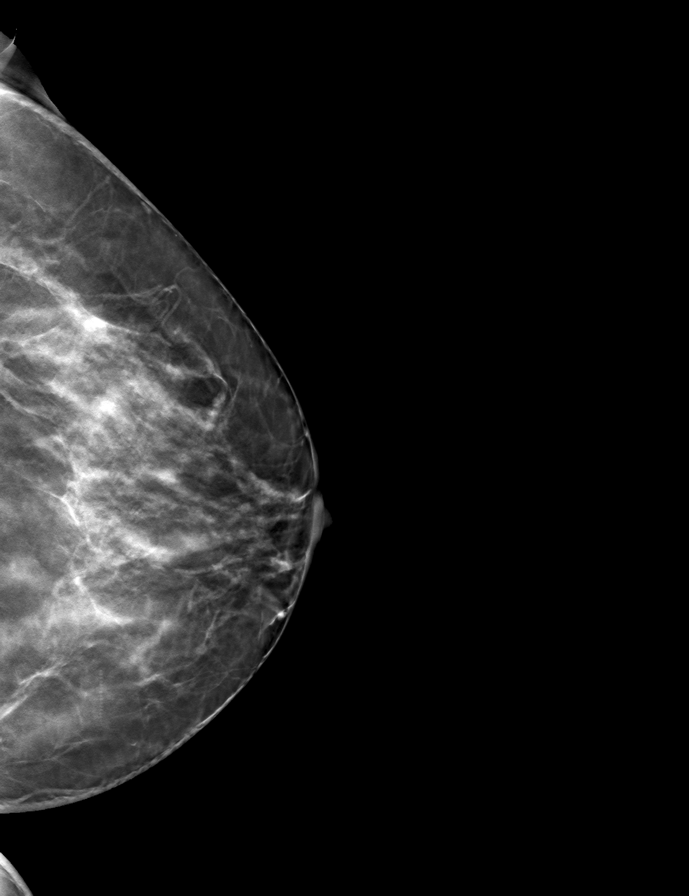
[frame 40/79]
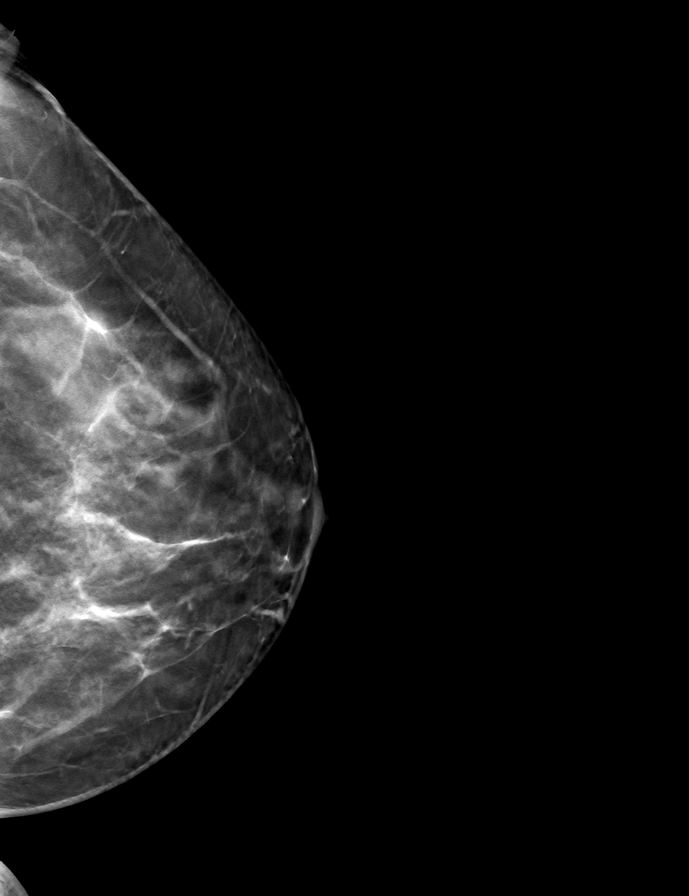

[L MLO tomo · tomo slice 37/73.0]
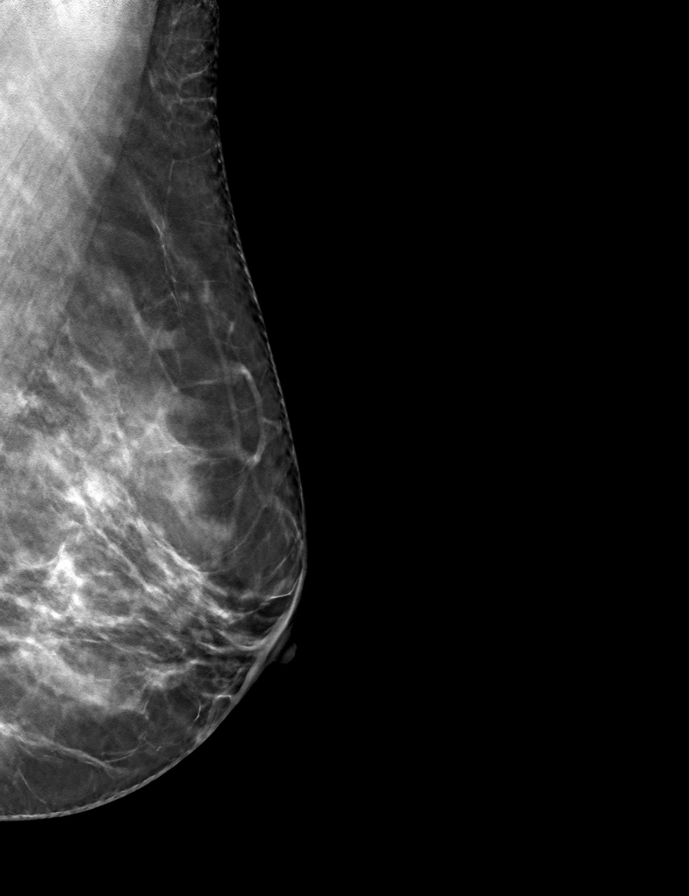

[R CC tomo · tomo slice 40/79.0]
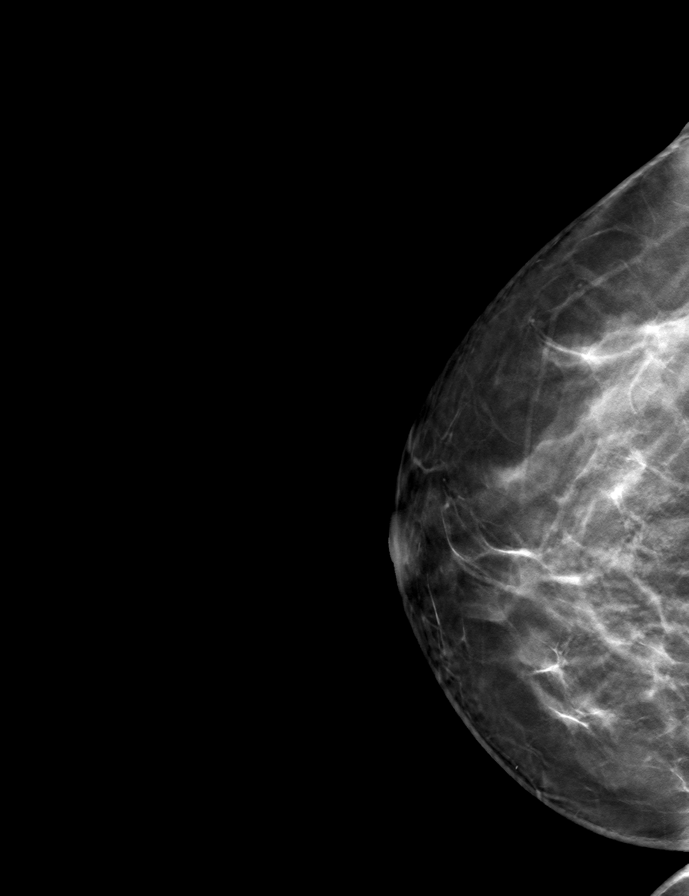

[R MLO tomo · tomo slice 39/78.0]
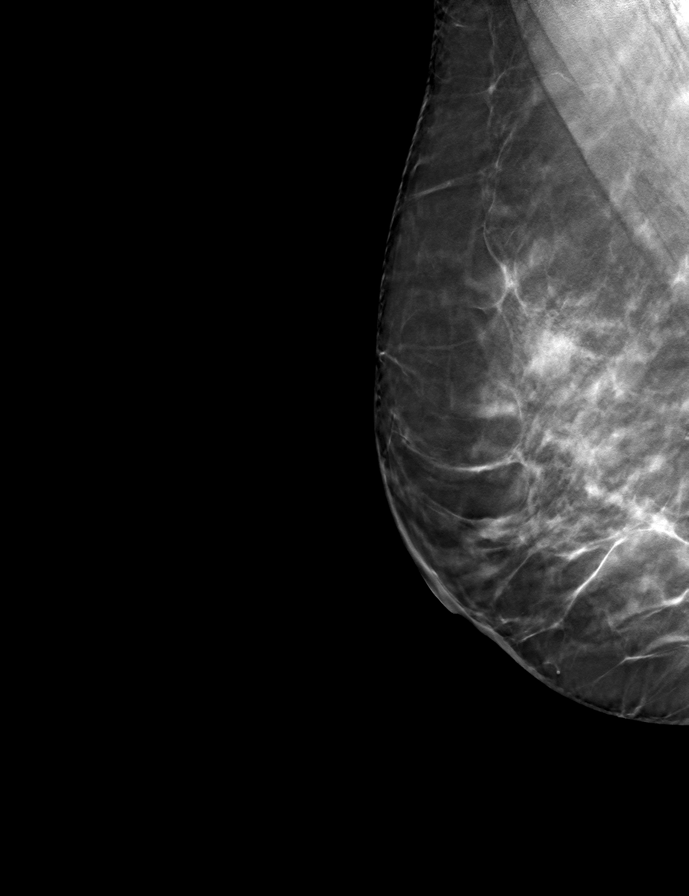

[9 of 24 positions shown; findings below may reference images not displayed]

ACR Breast Density Category c: The breast tissue is heterogeneously
dense, which may obscure small masses.
FINDINGS: There are no findings suspicious for malignancy.
IMPRESSION: No mammographic evidence of malignancy. A result letter of this
screening mammogram will be mailed directly to the patient.

RECOMMENDATION:
Screening mammogram in one year. (Code:Q3-W-BC3)

BI-RADS CATEGORY  1: Negative.

## 2022-09-27 ENCOUNTER — Other Ambulatory Visit (HOSPITAL_COMMUNITY): Payer: Self-pay

## 2022-09-27 DIAGNOSIS — Z0000 Encounter for general adult medical examination without abnormal findings: Secondary | ICD-10-CM | POA: Diagnosis not present

## 2022-09-27 DIAGNOSIS — Z6828 Body mass index (BMI) 28.0-28.9, adult: Secondary | ICD-10-CM | POA: Diagnosis not present

## 2022-09-27 DIAGNOSIS — Z01419 Encounter for gynecological examination (general) (routine) without abnormal findings: Secondary | ICD-10-CM | POA: Diagnosis not present

## 2022-09-27 DIAGNOSIS — Z8619 Personal history of other infectious and parasitic diseases: Secondary | ICD-10-CM | POA: Diagnosis not present

## 2022-09-27 DIAGNOSIS — R87610 Atypical squamous cells of undetermined significance on cytologic smear of cervix (ASC-US): Secondary | ICD-10-CM | POA: Diagnosis not present

## 2022-09-27 DIAGNOSIS — R635 Abnormal weight gain: Secondary | ICD-10-CM | POA: Diagnosis not present

## 2022-09-27 DIAGNOSIS — N912 Amenorrhea, unspecified: Secondary | ICD-10-CM | POA: Diagnosis not present

## 2022-09-27 DIAGNOSIS — Z1151 Encounter for screening for human papillomavirus (HPV): Secondary | ICD-10-CM | POA: Diagnosis not present

## 2022-09-27 DIAGNOSIS — Z1329 Encounter for screening for other suspected endocrine disorder: Secondary | ICD-10-CM | POA: Diagnosis not present

## 2022-09-27 DIAGNOSIS — Z01411 Encounter for gynecological examination (general) (routine) with abnormal findings: Secondary | ICD-10-CM | POA: Diagnosis not present

## 2022-09-27 MED ORDER — NORETHINDRONE ACET-ETHINYL EST 1-20 MG-MCG PO TABS
1.0000 | ORAL_TABLET | Freq: Every day | ORAL | 3 refills | Status: DC
  Filled 2022-09-27: qty 84, 84d supply, fill #0

## 2022-10-09 ENCOUNTER — Other Ambulatory Visit (HOSPITAL_COMMUNITY): Payer: Self-pay

## 2022-11-09 DIAGNOSIS — Z3202 Encounter for pregnancy test, result negative: Secondary | ICD-10-CM | POA: Diagnosis not present

## 2022-11-09 DIAGNOSIS — R87810 Cervical high risk human papillomavirus (HPV) DNA test positive: Secondary | ICD-10-CM | POA: Diagnosis not present

## 2022-11-09 DIAGNOSIS — N72 Inflammatory disease of cervix uteri: Secondary | ICD-10-CM | POA: Diagnosis not present

## 2022-11-09 DIAGNOSIS — R87610 Atypical squamous cells of undetermined significance on cytologic smear of cervix (ASC-US): Secondary | ICD-10-CM | POA: Diagnosis not present

## 2022-11-13 ENCOUNTER — Other Ambulatory Visit: Payer: Self-pay | Admitting: Obstetrics and Gynecology

## 2022-11-13 DIAGNOSIS — Z1231 Encounter for screening mammogram for malignant neoplasm of breast: Secondary | ICD-10-CM

## 2022-11-15 ENCOUNTER — Ambulatory Visit
Admission: RE | Admit: 2022-11-15 | Discharge: 2022-11-15 | Disposition: A | Discharge status: Home / Self Care | Payer: 59 | Source: Ambulatory Visit | Attending: Obstetrics and Gynecology | Admitting: Obstetrics and Gynecology

## 2022-11-15 DIAGNOSIS — Z1231 Encounter for screening mammogram for malignant neoplasm of breast: Secondary | ICD-10-CM

## 2022-11-19 ENCOUNTER — Other Ambulatory Visit: Payer: Self-pay | Admitting: Obstetrics and Gynecology

## 2022-11-19 DIAGNOSIS — R928 Other abnormal and inconclusive findings on diagnostic imaging of breast: Secondary | ICD-10-CM

## 2022-11-22 ENCOUNTER — Other Ambulatory Visit: Payer: Self-pay | Admitting: Obstetrics and Gynecology

## 2022-11-22 DIAGNOSIS — N63 Unspecified lump in unspecified breast: Secondary | ICD-10-CM

## 2022-11-22 DIAGNOSIS — R928 Other abnormal and inconclusive findings on diagnostic imaging of breast: Secondary | ICD-10-CM

## 2022-11-28 ENCOUNTER — Ambulatory Visit
Admission: RE | Admit: 2022-11-28 | Discharge: 2022-11-28 | Disposition: A | Discharge status: Home / Self Care | Payer: 59 | Source: Ambulatory Visit | Attending: Obstetrics and Gynecology | Admitting: Obstetrics and Gynecology

## 2022-11-28 DIAGNOSIS — R928 Other abnormal and inconclusive findings on diagnostic imaging of breast: Secondary | ICD-10-CM

## 2022-11-28 DIAGNOSIS — N6315 Unspecified lump in the right breast, overlapping quadrants: Secondary | ICD-10-CM | POA: Diagnosis not present

## 2022-11-28 DIAGNOSIS — N63 Unspecified lump in unspecified breast: Secondary | ICD-10-CM | POA: Diagnosis not present

## 2022-11-28 DIAGNOSIS — N6489 Other specified disorders of breast: Secondary | ICD-10-CM | POA: Diagnosis not present

## 2022-11-30 ENCOUNTER — Other Ambulatory Visit: Payer: 59

## 2023-02-13 DIAGNOSIS — L578 Other skin changes due to chronic exposure to nonionizing radiation: Secondary | ICD-10-CM | POA: Diagnosis not present

## 2023-02-13 DIAGNOSIS — L814 Other melanin hyperpigmentation: Secondary | ICD-10-CM | POA: Diagnosis not present

## 2023-05-01 ENCOUNTER — Other Ambulatory Visit: Payer: Self-pay | Admitting: Obstetrics and Gynecology

## 2023-05-01 DIAGNOSIS — N63 Unspecified lump in unspecified breast: Secondary | ICD-10-CM

## 2023-05-30 ENCOUNTER — Ambulatory Visit
Admission: RE | Admit: 2023-05-30 | Discharge: 2023-05-30 | Disposition: A | Discharge status: Home / Self Care | Payer: Commercial Managed Care - PPO | Source: Ambulatory Visit | Attending: Obstetrics and Gynecology | Admitting: Obstetrics and Gynecology

## 2023-05-30 DIAGNOSIS — N63 Unspecified lump in unspecified breast: Secondary | ICD-10-CM | POA: Insufficient documentation

## 2023-05-30 DIAGNOSIS — N6313 Unspecified lump in the right breast, lower outer quadrant: Secondary | ICD-10-CM | POA: Diagnosis not present

## 2023-05-30 DIAGNOSIS — N6311 Unspecified lump in the right breast, upper outer quadrant: Secondary | ICD-10-CM | POA: Diagnosis not present

## 2023-10-31 DIAGNOSIS — R87810 Cervical high risk human papillomavirus (HPV) DNA test positive: Secondary | ICD-10-CM | POA: Diagnosis not present

## 2023-10-31 DIAGNOSIS — Z8742 Personal history of other diseases of the female genital tract: Secondary | ICD-10-CM | POA: Diagnosis not present

## 2023-10-31 DIAGNOSIS — Z01419 Encounter for gynecological examination (general) (routine) without abnormal findings: Secondary | ICD-10-CM | POA: Diagnosis not present

## 2023-10-31 DIAGNOSIS — R87610 Atypical squamous cells of undetermined significance on cytologic smear of cervix (ASC-US): Secondary | ICD-10-CM | POA: Diagnosis not present

## 2023-10-31 DIAGNOSIS — Z124 Encounter for screening for malignant neoplasm of cervix: Secondary | ICD-10-CM | POA: Diagnosis not present

## 2023-11-05 ENCOUNTER — Other Ambulatory Visit: Payer: Self-pay | Admitting: Obstetrics and Gynecology

## 2023-11-05 DIAGNOSIS — N63 Unspecified lump in unspecified breast: Secondary | ICD-10-CM

## 2023-11-25 ENCOUNTER — Ambulatory Visit
Admission: RE | Admit: 2023-11-25 | Discharge: 2023-11-25 | Disposition: A | Discharge status: Home / Self Care | Payer: Commercial Managed Care - PPO | Source: Ambulatory Visit | Attending: Obstetrics and Gynecology | Admitting: Obstetrics and Gynecology

## 2023-11-25 DIAGNOSIS — N6001 Solitary cyst of right breast: Secondary | ICD-10-CM | POA: Diagnosis not present

## 2023-11-25 DIAGNOSIS — N6315 Unspecified lump in the right breast, overlapping quadrants: Secondary | ICD-10-CM | POA: Diagnosis not present

## 2023-11-25 DIAGNOSIS — N6311 Unspecified lump in the right breast, upper outer quadrant: Secondary | ICD-10-CM | POA: Diagnosis not present

## 2023-11-25 DIAGNOSIS — N63 Unspecified lump in unspecified breast: Secondary | ICD-10-CM | POA: Diagnosis not present

## 2023-11-25 DIAGNOSIS — R92323 Mammographic fibroglandular density, bilateral breasts: Secondary | ICD-10-CM | POA: Diagnosis not present

## 2024-01-24 ENCOUNTER — Other Ambulatory Visit (HOSPITAL_COMMUNITY): Payer: Self-pay | Admitting: Cardiology

## 2024-01-24 DIAGNOSIS — Z8249 Family history of ischemic heart disease and other diseases of the circulatory system: Secondary | ICD-10-CM

## 2024-02-04 ENCOUNTER — Ambulatory Visit (HOSPITAL_COMMUNITY)
Admission: RE | Admit: 2024-02-04 | Discharge: 2024-02-04 | Disposition: A | Discharge status: Home / Self Care | Payer: Self-pay | Source: Ambulatory Visit | Attending: Cardiology | Admitting: Cardiology

## 2024-02-04 ENCOUNTER — Encounter (HOSPITAL_COMMUNITY): Payer: Self-pay

## 2024-02-04 DIAGNOSIS — Z8249 Family history of ischemic heart disease and other diseases of the circulatory system: Secondary | ICD-10-CM | POA: Insufficient documentation

## 2024-03-15 DIAGNOSIS — H6121 Impacted cerumen, right ear: Secondary | ICD-10-CM | POA: Diagnosis not present

## 2024-03-15 DIAGNOSIS — H6122 Impacted cerumen, left ear: Secondary | ICD-10-CM | POA: Diagnosis not present

## 2024-10-08 ENCOUNTER — Encounter: Payer: Self-pay | Admitting: Obstetrics and Gynecology

## 2024-10-12 ENCOUNTER — Other Ambulatory Visit: Payer: Self-pay | Admitting: Obstetrics and Gynecology

## 2024-10-12 DIAGNOSIS — N63 Unspecified lump in unspecified breast: Secondary | ICD-10-CM

## 2024-11-25 ENCOUNTER — Inpatient Hospital Stay
Admission: RE | Admit: 2024-11-25 | Discharge: 2024-11-25 | Attending: Obstetrics and Gynecology | Admitting: Obstetrics and Gynecology

## 2024-11-25 DIAGNOSIS — N63 Unspecified lump in unspecified breast: Secondary | ICD-10-CM
